# Patient Record
Sex: Male | Born: 1995 | Race: Black or African American | Hispanic: No | Marital: Single | Smoking: Current some day smoker
Health system: Southern US, Community
[De-identification: ages and names within clinical notes are randomized; demographics above are authoritative.]

## PROBLEM LIST (undated history)

## (undated) DIAGNOSIS — Z789 Other specified health status: Secondary | ICD-10-CM

## (undated) HISTORY — PX: OTHER SURGICAL HISTORY: SHX169

---

## 2015-01-04 ENCOUNTER — Emergency Department (HOSPITAL_BASED_OUTPATIENT_CLINIC_OR_DEPARTMENT_OTHER): Payer: Self-pay

## 2015-01-04 ENCOUNTER — Emergency Department (HOSPITAL_BASED_OUTPATIENT_CLINIC_OR_DEPARTMENT_OTHER)
Admission: EM | Admit: 2015-01-04 | Discharge: 2015-01-04 | Disposition: A | Discharge status: Home / Self Care | Payer: Self-pay | Attending: Emergency Medicine | Admitting: Emergency Medicine

## 2015-01-04 ENCOUNTER — Encounter (HOSPITAL_BASED_OUTPATIENT_CLINIC_OR_DEPARTMENT_OTHER): Payer: Self-pay | Admitting: Emergency Medicine

## 2015-01-04 DIAGNOSIS — Z720 Tobacco use: Secondary | ICD-10-CM | POA: Insufficient documentation

## 2015-01-04 DIAGNOSIS — M79674 Pain in right toe(s): Secondary | ICD-10-CM | POA: Insufficient documentation

## 2015-01-04 MED ORDER — HYDROCODONE-ACETAMINOPHEN 5-325 MG PO TABS: 1.0000 | ORAL_TABLET | Freq: Four times a day (QID) | ORAL | Status: DC | PRN

## 2015-01-04 MED ORDER — HYDROCODONE-ACETAMINOPHEN 5-325 MG PO TABS
1.0000 | ORAL_TABLET | Freq: Once | ORAL | Status: AC
  Administered 2015-01-04: 1 via ORAL
  Filled 2015-01-04: qty 1

## 2015-01-04 NOTE — ED Provider Notes (Signed)
CSN: [id number]     Arrival date & time 01-11-2015  0144 History   First MD Initiated Contact with Patient Jan 11, 2015 0159     Chief Complaint  Patient presents with  . Foot Pain     (Consider location/radiation/quality/duration/timing/severity/associated sxs/prior Treatment) HPI  This is a 19 year old male who states he fell 3 weeks ago and hurt his right foot. There are several abrasions to the medial aspect of the foot. He is complaining of worsening pain in his right MTP joint which he rates his "a 10 out of 10, it's on a whole new level". He reports some drainage from the wound at that site earlier in the week. Pain is worse with movement or palpation as well as with weightbearing. He states his other wounds are healing without difficulty  History reviewed. No pertinent past medical history. History reviewed. No pertinent past surgical history. History reviewed. No pertinent family history. Social History  Substance Use Topics  . Smoking status: Current Some Day Smoker  . Smokeless tobacco: None  . Alcohol Use: No    Review of Systems  All other systems reviewed and are negative.   Allergies  Review of patient's allergies indicates no known allergies.  Home Medications   Prior to Admission medications   Not on File   BP 124/90 mmHg  Pulse 76  Temp(Src) 98 F (36.7 C) (Oral)  Resp 18  Ht  (1.702 m)  Wt 178 lb (80.74 kg)  BMI 27.87 kg/m2  SpO2 98%   Physical Exam  General: Well-developed, well-nourished male in no acute distress; appearance consistent with age of record HENT: normocephalic; atraumatic Eyes: Normal appearance Neck: supple Heart: regular rate and rhythm Lungs: Normal respiratory effort and excursion Abdomen: soft; nondistended Extremities: No deformity; healing abrasions to medial right foot; tenderness and pain on passive range of motion of right first MTP joint without erythema, warmth or swelling   Neurologic: Awake, alert and oriented;  motor function intact in all extremities and symmetric; no facial droop Skin: Warm and dry Psychiatric: Normal mood and affect   ED Course  Procedures (including critical care time)   MDM  Nursing notes and vitals signs, including pulse oximetry, reviewed.  Summary of this visit's results, reviewed by myself:  Imaging Studies: Dg Foot Complete Right  01/11/2015   CLINICAL DATA:  Acute onset of right foot pain, with associated soft tissue swelling. Initial encounter.  EXAM: RIGHT FOOT COMPLETE - 3+ VIEW  COMPARISON:  None.  FINDINGS: There is no evidence of fracture or dislocation. The joint spaces are preserved. There is no evidence of talar subluxation; the subtalar joint is unremarkable in appearance.  No significant soft tissue abnormalities are seen.  IMPRESSION: No evidence of fracture or dislocation.   Electronically Signed   By: Roanna Raider M.D.   On: January 11, 2015 03:04   The patient's abrasions appeared to be healing well and without signs of infection on examination. Specifically there is no erythema or warmth associated with the right MTP joint. There is no purulent drainage noted and the wound base is dry. There is no x-ray evidence of osteomyelitis or fracture.    Paula Libra, MD 01/11/15 [id number]

## 2015-01-04 NOTE — ED Notes (Addendum)
Patient reports that the difference tonight is that "it has been paining me a 10/10 tonight". Patient reports that he can not put any pressure on his foot without limping. While trying to triage the patient he is talking on the phone and does not get off until asked by the RN, also texting during the triage process. No limping noted by the patient or swelling to the foot or area of pain.

## 2015-01-04 NOTE — ED Notes (Signed)
3 weeks ago the patient reports that he fell and hurt his right foot. The patient states that it now is not healing. The patient reports that his pain is the worst it has ever been.

## 2015-07-20 ENCOUNTER — Emergency Department (HOSPITAL_BASED_OUTPATIENT_CLINIC_OR_DEPARTMENT_OTHER)
Admission: EM | Admit: 2015-07-20 | Discharge: 2015-07-20 | Disposition: A | Discharge status: Home / Self Care | Payer: Self-pay | Attending: Emergency Medicine | Admitting: Emergency Medicine

## 2015-07-20 ENCOUNTER — Encounter (HOSPITAL_BASED_OUTPATIENT_CLINIC_OR_DEPARTMENT_OTHER): Payer: Self-pay | Admitting: *Deleted

## 2015-07-20 DIAGNOSIS — N342 Other urethritis: Secondary | ICD-10-CM | POA: Insufficient documentation

## 2015-07-20 DIAGNOSIS — F17200 Nicotine dependence, unspecified, uncomplicated: Secondary | ICD-10-CM | POA: Insufficient documentation

## 2015-07-20 MED ORDER — LIDOCAINE HCL (PF) 1 % IJ SOLN
INTRAMUSCULAR | Status: AC
  Administered 2015-07-20: 0.9 mL
  Filled 2015-07-20: qty 5

## 2015-07-20 MED ORDER — CEFTRIAXONE SODIUM 250 MG IJ SOLR
250.0000 mg | Freq: Once | INTRAMUSCULAR | Status: AC
  Administered 2015-07-20: 250 mg via INTRAMUSCULAR
  Filled 2015-07-20: qty 250

## 2015-07-20 MED ORDER — AZITHROMYCIN 250 MG PO TABS
1000.0000 mg | ORAL_TABLET | Freq: Once | ORAL | Status: AC
  Administered 2015-07-20: 1000 mg via ORAL
  Filled 2015-07-20: qty 4

## 2015-07-20 NOTE — ED Notes (Signed)
D/C home. Pt verbalizes understanding of f/u care

## 2015-07-20 NOTE — Discharge Instructions (Signed)
We will call you if your cultures indicate you require further treatment or additional action.  No sexual activity for the next 2 weeks.  Return to the ER if symptoms significantly worsen or change.   Urethritis, Adult Urethritis is an inflammation of the tube through which urine exits your bladder (urethra).  CAUSES Urethritis is often caused by an infection in your urethra. The infection can be viral, like herpes. The infection can also be bacterial, like gonorrhea. RISK FACTORS Risk factors of urethritis include:  Having sex without using a condom.  Having multiple sexual partners.  Having poor hygiene. SIGNS AND SYMPTOMS Symptoms of urethritis are less noticeable in women than in men. These symptoms include:  Burning feeling when you urinate (dysuria).  Discharge from your urethra.  Blood in your urine (hematuria).  Urinating more than usual. DIAGNOSIS  To confirm a diagnosis of urethritis, your health care provider will do the following:  Ask about your sexual history.  Perform a physical exam.  Have you provide a sample of your urine for lab testing.  Use a cotton swab to gently collect a sample from your urethra for lab testing. TREATMENT  It is important to treat urethritis. Depending on the cause, untreated urethritis may lead to serious genital infections and possibly infertility. Urethritis caused by a bacterial infection is treated with antibiotic medicine. All sexual partners must be treated.  HOME CARE INSTRUCTIONS  Do not have sex until the test results are known and treatment is completed, even if your symptoms go away before you finish treatment.  If you were prescribed an antibiotic, finish it all even if you start to feel better. SEEK MEDICAL CARE IF:   Your symptoms are not improved in 3 days.  Your symptoms are getting worse.  You develop abdominal pain or pelvic pain (in women).  You develop joint pain.  You have a fever. SEEK IMMEDIATE  MEDICAL CARE IF:   You have severe pain in the belly, back, or side.  You have repeated vomiting. MAKE SURE YOU:  Understand these instructions.  Will watch your condition.  Will get help right away if you are not doing well or get worse.   This information is not intended to replace advice given to you by your health care provider. Make sure you discuss any questions you have with your health care provider.   Document Released: 10/18/2000 Document Revised: 09/08/2014 Document Reviewed: 12/23/2012 Elsevier Interactive Patient Education [employer]2016 Elsevier Inc[employer]

## 2015-07-20 NOTE — ED Notes (Signed)
MD at bedside. 

## 2015-07-20 NOTE — ED Notes (Signed)
Pt amb to room 5 with quick steady gait smiling in nad. Pt reports burning at the tip of his penis with urination and discharge x Sunday.

## 2015-07-20 NOTE — ED Provider Notes (Signed)
CSN: [id number]     Arrival date & time 07/20/15  1039 History   First MD Initiated Contact with Patient 07/20/15 1059     Chief Complaint  Patient presents with  . Penile Discharge     (Consider location/radiation/quality/duration/timing/severity/associated sxs/prior Treatment) HPI Comments: Patient presents with complaints of urethral discharge and burning with urination for the past 2 days. He reports resuming sexual activity with his ex-girlfriend and suspects he may have gotten STD. She is also here with him seeking treatment.  Patient is a 20 y.o. male presenting with penile discharge. The history is provided by the patient.  Penile Discharge This is a new problem. The current episode started 2 days ago. The problem occurs constantly. The problem has been rapidly worsening. Nothing aggravates the symptoms. Nothing relieves the symptoms. He has tried nothing for the symptoms. The treatment provided no relief.    History reviewed. No pertinent past medical history. History reviewed. No pertinent past surgical history. History reviewed. No pertinent family history. Social History  Substance Use Topics  . Smoking status: Current Some Day Smoker  . Smokeless tobacco: None  . Alcohol Use: No    Review of Systems  Genitourinary: Positive for discharge.  All other systems reviewed and are negative.     Allergies  Review of patient's allergies indicates no known allergies.  Home Medications   Prior to Admission medications   Not on File   BP 123/79 mmHg  Pulse 58  Temp(Src) 98.3 F (36.8 C) (Oral)  Resp 16  Ht 5\' 7"  (1.702 m)  Wt 131 lb (59.421 kg)  BMI 20.51 kg/m2  SpO2 100% Physical Exam  Constitutional: He is oriented to person, place, and time. He appears well-developed and well-nourished. No distress.  HENT:  Head: Normocephalic and atraumatic.  Neck: Normal range of motion. Neck supple.  Genitourinary:  There is purulent urethral discharge present. He is  exquisitely tender with palpation and urethral swab insertion.  Neurological: He is alert and oriented to person, place, and time.  Skin: Skin is warm and dry. He is not diaphoretic.  Nursing note and vitals reviewed.   ED Course  Procedures (including critical care time) Labs Review Labs Reviewed - No data to display  Imaging Review No results found. I have personally reviewed and evaluated these images and lab results as part of my medical decision-making.   MDM   Final diagnoses:  None   I had a high suspicion for gonorrhea or chlamydia. We'll treat with Rocephin and Zithromax. Urethral swab sent to the lab.    Geoffery Lyonsouglas Zandrea Kenealy, MD 07/20/15 1115

## 2015-07-21 LAB — GC/CHLAMYDIA PROBE AMP (~~LOC~~) NOT AT ARMC
Chlamydia: NEGATIVE
NEISSERIA GONORRHEA: POSITIVE — AB

## 2015-07-22 ENCOUNTER — Telehealth: Payer: Self-pay | Admitting: *Deleted

## 2015-07-22 NOTE — ED Notes (Signed)
Spoke with patient, verified ID, informed of labs, treated per protocol, DHHS form faxed, patient informed to abstain for sexual activity x 10 days and notify sexual partners for evaluation and treatment 

## 2015-08-12 ENCOUNTER — Emergency Department (HOSPITAL_BASED_OUTPATIENT_CLINIC_OR_DEPARTMENT_OTHER): Payer: Self-pay

## 2015-08-12 ENCOUNTER — Encounter (HOSPITAL_BASED_OUTPATIENT_CLINIC_OR_DEPARTMENT_OTHER): Payer: Self-pay | Admitting: Emergency Medicine

## 2015-08-12 ENCOUNTER — Emergency Department (HOSPITAL_BASED_OUTPATIENT_CLINIC_OR_DEPARTMENT_OTHER)
Admission: EM | Admit: 2015-08-12 | Discharge: 2015-08-12 | Disposition: A | Discharge status: Home / Self Care | Payer: Self-pay | Attending: Emergency Medicine | Admitting: Emergency Medicine

## 2015-08-12 DIAGNOSIS — F17210 Nicotine dependence, cigarettes, uncomplicated: Secondary | ICD-10-CM | POA: Insufficient documentation

## 2015-08-12 DIAGNOSIS — M25522 Pain in left elbow: Secondary | ICD-10-CM

## 2015-08-12 DIAGNOSIS — S59902A Unspecified injury of left elbow, initial encounter: Secondary | ICD-10-CM | POA: Insufficient documentation

## 2015-08-12 DIAGNOSIS — Y9389 Activity, other specified: Secondary | ICD-10-CM | POA: Insufficient documentation

## 2015-08-12 DIAGNOSIS — Y998 Other external cause status: Secondary | ICD-10-CM | POA: Insufficient documentation

## 2015-08-12 DIAGNOSIS — Y9289 Other specified places as the place of occurrence of the external cause: Secondary | ICD-10-CM | POA: Insufficient documentation

## 2015-08-12 MED ORDER — ACETAMINOPHEN 500 MG PO TABS
1000.0000 mg | ORAL_TABLET | Freq: Once | ORAL | Status: AC
  Administered 2015-08-12: 1000 mg via ORAL
  Filled 2015-08-12: qty 2
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
## 2015-08-12 NOTE — ED Notes (Signed)
MD at bedside. 

## 2015-08-12 NOTE — ED Notes (Signed)
Patient advised not to wear sling while sleeping.

## 2015-08-12 NOTE — ED Notes (Signed)
Patient is alert and oriented x3.  He was given DC instructions and follow up visit instructions.  Patient gave verbal understanding.  He was DC ambulatory under his own power to home.  V/S stable.  He was not showing any signs of distress on DC 

## 2015-08-12 NOTE — ED Notes (Signed)
Work note given for 4/5-10/2015 per Dr Shela CommonsJ.

## 2015-08-12 NOTE — Discharge Instructions (Signed)
Take Tylenol as directed for pain. Wear the sling as needed for comfort. If you have significant pain or difficulty using her elbow in one week call Dr. Pearletha ForgeHudnall to schedule appointment. There is a possibility that you have a tiny break in your left elbow, not seen on today's x-ray How to Use a Sling A sling is a type of hanging bandage that is worn around your neck to protect an injured arm, shoulder, or other body part. You may need to wear a sling to keep you from moving (immobilize) the injured body part while it heals. Keeping the injured part of your body still reduces pain and speeds up healing. Your health care provider may recommend using a sling if you have:   A broken arm.  A broken collarbone.  A shoulder injury.  Surgery. RISKS AND COMPLICATIONS Wearing a sling the wrong way can:  Make your injury worse.  Cause stiffness or numbness.  Affect blood circulation in your arm and hand. This can causetingling or numbness in your fingers or hands. HOW TO USE A SLING The way that you should use a sling depends on your injury. It is important that you follow all of your health care provider's instructions for your injury. Also follow these general guidelines:  Wear the sling so that your arm bends 90 degrees at the elbow. That is like a right angle or the shape of a capital letter "L." The sling should also support your wrist and your hand.  Try to avoid moving your arm.  Do not lie down flat on your back while wearing a sling. Sleep in a recliner or use pillows to raise your upper body in bed.  Do not twist, raise, or move your arm in a way that could make your injury worse.  Do not lean on your arm while wearing a sling.  Do not lift anything while wearing a sling. SEEK MEDICAL CARE IF:  You have bruising, swelling, or pain that is getting worse.  Your pain medicine is not helping.  You have a fever. SEEK IMMEDIATE MEDICAL CARE IF:  Your fingers are numb or  tingling.  Your fingers turn blue or feel cold to the touch.  You cannot control the bleeding from your injury.  You are short of breath.   This information is not intended to replace advice given to you by your health care provider. Make sure you discuss any questions you have with your health care provider.   Document Released: 12/07/2003 Document Revised: 05/15/2014 Document Reviewed: 02/25/2014 Elsevier Interactive Patient Education [employer]2016 Elsevier Inc.

## 2015-08-12 NOTE — ED Notes (Signed)
Patient returned from XR. 
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
## 2015-12-15 ENCOUNTER — Encounter (HOSPITAL_BASED_OUTPATIENT_CLINIC_OR_DEPARTMENT_OTHER): Payer: Self-pay

## 2015-12-15 ENCOUNTER — Emergency Department (HOSPITAL_BASED_OUTPATIENT_CLINIC_OR_DEPARTMENT_OTHER)
Admission: EM | Admit: 2015-12-15 | Discharge: 2015-12-15 | Disposition: A | Discharge status: Home / Self Care | Payer: Medicaid Other | Attending: Emergency Medicine | Admitting: Emergency Medicine

## 2015-12-15 ENCOUNTER — Emergency Department (HOSPITAL_BASED_OUTPATIENT_CLINIC_OR_DEPARTMENT_OTHER): Payer: Medicaid Other

## 2015-12-15 DIAGNOSIS — S161XXD Strain of muscle, fascia and tendon at neck level, subsequent encounter: Secondary | ICD-10-CM

## 2015-12-15 DIAGNOSIS — S39012D Strain of muscle, fascia and tendon of lower back, subsequent encounter: Secondary | ICD-10-CM

## 2015-12-15 DIAGNOSIS — F17210 Nicotine dependence, cigarettes, uncomplicated: Secondary | ICD-10-CM | POA: Insufficient documentation

## 2015-12-15 DIAGNOSIS — Y92410 Unspecified street and highway as the place of occurrence of the external cause: Secondary | ICD-10-CM | POA: Insufficient documentation

## 2015-12-15 DIAGNOSIS — Y939 Activity, unspecified: Secondary | ICD-10-CM | POA: Insufficient documentation

## 2015-12-15 DIAGNOSIS — Y999 Unspecified external cause status: Secondary | ICD-10-CM | POA: Insufficient documentation

## 2015-12-15 MED ORDER — CYCLOBENZAPRINE HCL 10 MG PO TABS
10.0000 mg | ORAL_TABLET | Freq: Once | ORAL | Status: AC
  Administered 2015-12-15: 10 mg via ORAL
  Filled 2015-12-15: qty 1

## 2015-12-15 MED ORDER — CYCLOBENZAPRINE HCL 10 MG PO TABS: 10.0000 mg | ORAL_TABLET | Freq: Three times a day (TID) | ORAL | 0 refills | Status: DC

## 2015-12-15 MED ORDER — IBUPROFEN 800 MG PO TABS: 800.0000 mg | ORAL_TABLET | Freq: Three times a day (TID) | ORAL | 0 refills | Status: DC

## 2015-12-15 MED ORDER — KETOROLAC TROMETHAMINE 60 MG/2ML IM SOLN
60.0000 mg | Freq: Once | INTRAMUSCULAR | Status: AC
  Administered 2015-12-15: 60 mg via INTRAMUSCULAR
  Filled 2015-12-15: qty 2
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
## 2015-12-15 NOTE — ED Notes (Signed)
MD at bedside. 

## 2015-12-15 NOTE — ED Notes (Signed)
Pt verbalizes understanding of d/c instructions and denies any further needs at this time. 

## 2015-12-15 NOTE — ED Notes (Signed)
Patient transported to X-ray 
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
## 2017-06-05 ENCOUNTER — Emergency Department (HOSPITAL_BASED_OUTPATIENT_CLINIC_OR_DEPARTMENT_OTHER)
Admission: EM | Admit: 2017-06-05 | Discharge: 2017-06-05 | Disposition: A | Discharge status: Home / Self Care | Payer: Self-pay | Attending: Emergency Medicine | Admitting: Emergency Medicine

## 2017-06-05 ENCOUNTER — Encounter (HOSPITAL_BASED_OUTPATIENT_CLINIC_OR_DEPARTMENT_OTHER): Payer: Self-pay | Admitting: Emergency Medicine

## 2017-06-05 ENCOUNTER — Other Ambulatory Visit: Payer: Self-pay

## 2017-06-05 DIAGNOSIS — R509 Fever, unspecified: Secondary | ICD-10-CM | POA: Insufficient documentation

## 2017-06-05 DIAGNOSIS — F17210 Nicotine dependence, cigarettes, uncomplicated: Secondary | ICD-10-CM | POA: Insufficient documentation

## 2017-06-05 DIAGNOSIS — N342 Other urethritis: Secondary | ICD-10-CM | POA: Insufficient documentation

## 2017-06-05 DIAGNOSIS — R05 Cough: Secondary | ICD-10-CM | POA: Insufficient documentation

## 2017-06-05 DIAGNOSIS — R369 Urethral discharge, unspecified: Secondary | ICD-10-CM | POA: Insufficient documentation

## 2017-06-05 DIAGNOSIS — Z202 Contact with and (suspected) exposure to infections with a predominantly sexual mode of transmission: Secondary | ICD-10-CM | POA: Insufficient documentation

## 2017-06-05 MED ORDER — AZITHROMYCIN 250 MG PO TABS
1000.0000 mg | ORAL_TABLET | Freq: Once | ORAL | Status: AC
  Administered 2017-06-05: 1000 mg via ORAL
  Filled 2017-06-05: qty 4

## 2017-06-05 MED ORDER — CEFTRIAXONE SODIUM 250 MG IJ SOLR
250.0000 mg | Freq: Once | INTRAMUSCULAR | Status: AC
  Administered 2017-06-05: 250 mg via INTRAMUSCULAR
  Filled 2017-06-05: qty 250

## 2017-06-05 NOTE — ED Triage Notes (Signed)
Pt reports flu like sx but then states his girlfriend told him that she tested positive for an STD. Pt does report penile drainage. States he is here for STD treatment.

## 2017-06-05 NOTE — ED Provider Notes (Signed)
[facility] EMERGENCY DEPARTMENT Provider Note   CSN: [id number] Arrival date & time: 06/05/17  0023     History   Chief Complaint Chief Complaint  Patient presents with  . **  Chief complaint - STD check  HPI Shawn Flynn is a 22 y.o. male.  The history is provided by the patient.  Patient reports he is in the emergency department for evaluation of possible STD.  He reports his girlfriend informed him that she recently tested positive for gonorrhea.  Patient reports that he has had some mild penile discharge.  He reports recently having fever and cough.  He denies any other complaints.  No significant abdominal pain.  His course is stable.  Nothing improves his symptoms.  PMH-none Past Surgical History:  Procedure Laterality Date  . arm surgery         Home Medications    Prior to Admission medications   Medication Sig Start Date End Date Taking? Authorizing Provider  cyclobenzaprine (FLEXERIL) 10 MG tablet Take 1 tablet (10 mg total) by mouth 3 (three) times daily. 12/15/15   Arby Barrette, MD  ibuprofen (ADVIL,MOTRIN) 800 MG tablet Take 1 tablet (800 mg total) by mouth 3 (three) times daily. 12/15/15   Arby Barrette, MD    Family History No family history on file.  Social History Social History   Tobacco Use  . Smoking status: Current Some Day Smoker    Packs/day: 0.50    Types: Cigarettes  . Smokeless tobacco: Never Used  Substance Use Topics  . Alcohol use: No  . Drug use: No     Allergies   Patient has no known allergies.   Review of Systems Review of Systems  Constitutional: Positive for fever.  Genitourinary: Positive for discharge.     Physical Exam Updated Vital Signs BP 134/90 (BP Location: Left Arm)   Pulse 86   Temp 98.9 F (37.2 C) (Oral)   Resp 18   SpO2 99%   Physical Exam CONSTITUTIONAL: Well developed/well nourished, sleeping on arrival to room HEAD: Normocephalic/atraumatic ENMT: Mucous membranes moist NECK:  supple no meningeal signs CV: S1/S2 noted, no murmurs/rubs/gallops noted LUNGS: Lungs are clear to auscultation bilaterally, no apparent distress ABDOMEN: soft, nontender GU:no cva tenderness Nurse Tresa Endo present for entire exam, no penile discharge, no penile lesions, no scrotal edema noted NEURO: Pt is awake/alert/appropriate, moves all extremitiesx4.  No facial droop.   EXTREMITIES:  full ROM SKIN: warm, color normal PSYCH: no abnormalities of mood noted, alert and oriented to situation   ED Treatments / Results  Labs (all labs ordered are listed, but only abnormal results are displayed) Labs Reviewed  HIV ANTIBODY (ROUTINE TESTING)  GC/CHLAMYDIA PROBE AMP ([facility]) NOT AT [facility]    EKG  EKG Interpretation None       Radiology No results found.  Procedures Procedures (including critical care time)  Medications Ordered in ED Medications  cefTRIAXone (ROCEPHIN) injection 250 mg (not administered)  azithromycin (ZITHROMAX) tablet 1,000 mg (not administered)     Initial Impression / Assessment and Plan / ED Course  I have reviewed the triage vital signs and the nursing notes.     Plan to empirically treat for gonorrhea and chlamydia here in the emergency department.  Will also test for gonorrhea/chlamydia.  Patient will also have HIV testing performed.  Advised to avoid all sexual contact until all symptoms resolved until all labs have resulted.  Final Clinical Impressions(s) / ED Diagnoses   Final diagnoses:  STD  exposure  Urethritis    ED Discharge Orders    None       Zadie RhineWickline, Sharai Overbay, MD 06/05/17 [phone]

## 2017-06-06 LAB — HIV ANTIBODY (ROUTINE TESTING W REFLEX): HIV Screen 4th Generation wRfx: NONREACTIVE

## 2017-06-06 LAB — GC/CHLAMYDIA PROBE AMP (~~LOC~~) NOT AT ARMC
Chlamydia: POSITIVE — AB
NEISSERIA GONORRHEA: NEGATIVE

## 2017-06-22 ENCOUNTER — Emergency Department (HOSPITAL_BASED_OUTPATIENT_CLINIC_OR_DEPARTMENT_OTHER)
Admission: EM | Admit: 2017-06-22 | Discharge: 2017-06-22 | Disposition: A | Discharge status: Home / Self Care | Payer: Self-pay | Attending: Emergency Medicine | Admitting: Emergency Medicine

## 2017-06-22 ENCOUNTER — Other Ambulatory Visit: Payer: Self-pay

## 2017-06-22 ENCOUNTER — Encounter (HOSPITAL_BASED_OUTPATIENT_CLINIC_OR_DEPARTMENT_OTHER): Payer: Self-pay

## 2017-06-22 DIAGNOSIS — R51 Headache: Secondary | ICD-10-CM | POA: Insufficient documentation

## 2017-06-22 DIAGNOSIS — K0889 Other specified disorders of teeth and supporting structures: Secondary | ICD-10-CM | POA: Insufficient documentation

## 2017-06-22 NOTE — ED Triage Notes (Signed)
Pt c/o left temporal HA, pain to left upper, lower teeth and sore throat-x today-NAD-steady gait

## 2017-06-22 NOTE — ED Notes (Signed)
After triage pt was notified of wait-states he is not waiting and left-NAD-fast paced steady gait

## 2017-11-25 ENCOUNTER — Other Ambulatory Visit: Payer: Self-pay

## 2017-11-25 ENCOUNTER — Encounter (HOSPITAL_BASED_OUTPATIENT_CLINIC_OR_DEPARTMENT_OTHER): Payer: Self-pay | Admitting: *Deleted

## 2017-11-25 ENCOUNTER — Emergency Department (HOSPITAL_BASED_OUTPATIENT_CLINIC_OR_DEPARTMENT_OTHER)
Admission: EM | Admit: 2017-11-25 | Discharge: 2017-11-26 | Disposition: A | Discharge status: Home / Self Care | Payer: Self-pay | Attending: Emergency Medicine | Admitting: Emergency Medicine

## 2017-11-25 ENCOUNTER — Emergency Department (HOSPITAL_BASED_OUTPATIENT_CLINIC_OR_DEPARTMENT_OTHER): Payer: Self-pay

## 2017-11-25 DIAGNOSIS — S0101XA Laceration without foreign body of scalp, initial encounter: Secondary | ICD-10-CM | POA: Insufficient documentation

## 2017-11-25 DIAGNOSIS — S0181XA Laceration without foreign body of other part of head, initial encounter: Secondary | ICD-10-CM | POA: Insufficient documentation

## 2017-11-25 DIAGNOSIS — Y999 Unspecified external cause status: Secondary | ICD-10-CM | POA: Insufficient documentation

## 2017-11-25 DIAGNOSIS — S02601A Fracture of unspecified part of body of right mandible, initial encounter for closed fracture: Secondary | ICD-10-CM

## 2017-11-25 DIAGNOSIS — W01198A Fall on same level from slipping, tripping and stumbling with subsequent striking against other object, initial encounter: Secondary | ICD-10-CM | POA: Insufficient documentation

## 2017-11-25 DIAGNOSIS — F17210 Nicotine dependence, cigarettes, uncomplicated: Secondary | ICD-10-CM | POA: Insufficient documentation

## 2017-11-25 DIAGNOSIS — Y929 Unspecified place or not applicable: Secondary | ICD-10-CM | POA: Insufficient documentation

## 2017-11-25 DIAGNOSIS — Y9389 Activity, other specified: Secondary | ICD-10-CM | POA: Insufficient documentation

## 2017-11-25 DIAGNOSIS — Z23 Encounter for immunization: Secondary | ICD-10-CM | POA: Insufficient documentation

## 2017-11-25 MED ORDER — TETANUS-DIPHTH-ACELL PERTUSSIS 5-2.5-18.5 LF-MCG/0.5 IM SUSP
0.5000 mL | Freq: Once | INTRAMUSCULAR | Status: AC
  Administered 2017-11-25: 0.5 mL via INTRAMUSCULAR
  Filled 2017-11-25: qty 0.5
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
## 2017-11-25 NOTE — ED Notes (Signed)
ED Provider at bedside. 

## 2017-11-26 ENCOUNTER — Ambulatory Visit (HOSPITAL_COMMUNITY)
Admission: RE | Admit: 2017-11-26 | Discharge: 2017-11-26 | Disposition: A | Discharge status: Home / Self Care | Payer: Self-pay | Source: Ambulatory Visit | Attending: Oral Surgery | Admitting: Oral Surgery

## 2017-11-26 ENCOUNTER — Ambulatory Visit: Payer: Self-pay | Admitting: Oral Surgery

## 2017-11-26 ENCOUNTER — Encounter (HOSPITAL_COMMUNITY): Payer: Self-pay | Admitting: *Deleted

## 2017-11-26 ENCOUNTER — Encounter (HOSPITAL_COMMUNITY)
Admission: RE | Disposition: A | Discharge status: Home / Self Care | Payer: Self-pay | Source: Ambulatory Visit | Attending: Oral Surgery

## 2017-11-26 DIAGNOSIS — S02609B Fracture of mandible, unspecified, initial encounter for open fracture: Secondary | ICD-10-CM | POA: Insufficient documentation

## 2017-11-26 DIAGNOSIS — Z538 Procedure and treatment not carried out for other reasons: Secondary | ICD-10-CM | POA: Insufficient documentation

## 2017-11-26 HISTORY — DX: Other specified health status: Z78.9

## 2017-11-26 SURGERY — OPEN REDUCTION INTERNAL FIXATION (ORIF) MANDIBULAR FRACTURE
Anesthesia: General | Laterality: Right

## 2017-11-26 MED ORDER — DEXAMETHASONE SODIUM PHOSPHATE 10 MG/ML IJ SOLN
INTRAMUSCULAR | Status: AC
  Filled 2017-11-26: qty 1

## 2017-11-26 MED ORDER — CHLORHEXIDINE GLUCONATE 0.12 % MT SOLN
15.0000 mL | Freq: Two times a day (BID) | OROMUCOSAL | Status: DC
  Filled 2017-11-26: qty 15

## 2017-11-26 MED ORDER — FENTANYL CITRATE (PF) 250 MCG/5ML IJ SOLN
INTRAMUSCULAR | Status: AC
  Filled 2017-11-26: qty 5

## 2017-11-26 MED ORDER — MIDAZOLAM HCL 2 MG/2ML IJ SOLN
INTRAMUSCULAR | Status: AC
  Filled 2017-11-26: qty 2

## 2017-11-26 MED ORDER — PROPOFOL 10 MG/ML IV BOLUS
INTRAVENOUS | Status: AC
  Filled 2017-11-26: qty 20

## 2017-11-26 MED ORDER — DEXAMETHASONE SODIUM PHOSPHATE 10 MG/ML IJ SOLN: 10.0000 mg | Freq: Once | INTRAMUSCULAR | Status: DC

## 2017-11-26 MED ORDER — MORPHINE SULFATE (PF) 4 MG/ML IV SOLN
4.0000 mg | Freq: Once | INTRAVENOUS | Status: AC
  Administered 2017-11-26: 4 mg via INTRAVENOUS
  Filled 2017-11-26: qty 1

## 2017-11-26 MED ORDER — CLINDAMYCIN PHOSPHATE 600 MG/50ML IV SOLN
600.0000 mg | Freq: Once | INTRAVENOUS | Status: AC
  Administered 2017-11-26: 600 mg via INTRAVENOUS
  Filled 2017-11-26: qty 50

## 2017-11-26 MED ORDER — LACTATED RINGERS IV SOLN: INTRAVENOUS | Status: DC

## 2017-11-26 MED ORDER — CEFAZOLIN SODIUM-DEXTROSE 2-4 GM/100ML-% IV SOLN
INTRAVENOUS | Status: AC
  Filled 2017-11-26: qty 100

## 2017-11-26 MED ORDER — OXYCODONE-ACETAMINOPHEN 5-325 MG PO TABS: 1.0000 | ORAL_TABLET | Freq: Four times a day (QID) | ORAL | 0 refills | Status: DC | PRN

## 2017-11-26 MED ORDER — CEFAZOLIN SODIUM-DEXTROSE 2-4 GM/100ML-% IV SOLN: 2.0000 g | INTRAVENOUS | Status: DC

## 2017-11-26 NOTE — ED Notes (Signed)
ED Provider at bedside. 

## 2017-11-26 NOTE — Pre-Procedure Instructions (Signed)
    Corrie Dandyimothy J Sandridge  11/26/2017     Your procedure is scheduled on 11/27/2017.  [facility] at [facility].M.  Call this number if you have problems the morning of surgery:  [phone]   Remember:  Do not eat or drink after midnight.     Take these medicines the morning of surgery with A SIP OF WATER Oxycodone    Do not wear jewelry, make-up or nail polish.  Do not wear lotions, powders, or perfumes, or deodorant.  Do not shave 48 hours prior to surgery.  Men may shave face and neck.  Do not bring valuables to the hospital.  [facility] is not responsible for any belongings or valuables.  Contacts, dentures or bridgework may not be worn into surgery.  Leave your suitcase in the car.  After surgery it may be brought to your room.  For patients admitted to the hospital, discharge time will be determined by your treatment team.  Patients discharged the day of surgery will not be allowed to drive home.   [facility] - Preparing for Surgery  Before surgery, you can play an important role.  Because skin is not sterile, your skin needs to be as free of germs as possible.  You can reduce the number of germs on you skin by washing with CHG (chlorahexidine gluconate) soap before surgery.  CHG is an antiseptic cleaner which kills germs and bonds with the skin to continue killing germs even after washing.  Please DO NOT use if you have an allergy to CHG or antibacterial soaps.  If your skin becomes reddened/irritated stop using the CHG and inform your nurse when you arrive at Short Stay.  Do not shave (including legs and underarms) for at least 48 hours prior to the first CHG shower.  You may shave your face.  Please follow these instructions carefully:   1.  Shower with CHG Soap the night before surgery and the morning of Surgery.  2.  If you choose to wash your hair, wash your hair first as usual with your normal shampoo.  3.  After you shampoo, rinse your hair and  body thoroughly to remove the shampoo.  4.  Use CHG as you would any other liquid soap.  You can apply CHG directly to the skin and wash gently with scrungie or a clean washcloth.  5.  Apply the CHG Soap to your body ONLY FROM THE NECK DOWN.  Do not use on open wounds or open sores.  Avoid contact with your eyes, ears, mouth and genitals (private parts).  Wash genitals (private parts) with your normal soap.  6.  Wash thoroughly, paying special attention to the area where your surgery will be performed.  7.  Thoroughly rinse your body with warm water from the neck down.  8.  DO NOT shower/wash with your normal soap after using and rinsing off the CHG Soap.  9.  Pat yourself dry with a clean towel.            10.  Wear clean pajamas.            11.  Place clean sheets on your bed the night of your first shower and do not sleep with pets.  Day of Surgery  Do not apply any lotions the morning of surgery.  Please wear clean clothes to the hospital/surgery center.

## 2017-11-26 NOTE — ED Notes (Signed)
Patient transported to CT 

## 2017-11-26 NOTE — Progress Notes (Signed)
Dr. Krista BlueSinger made aware of PO intake. Per Dr. Krista BlueSinger delay surgery until 1930
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
## 2017-11-26 NOTE — ED Provider Notes (Signed)
[facility] EMERGENCY DEPARTMENT Provider Note   CSN: [id number] Arrival date & time: 11/25/17  2329     History   Chief Complaint Chief Complaint  Patient presents with  . Facial Laceration    HPI Quindarius Cabello is a 22 y.o. male.  HPI  This is a 22 year old male who presents with facial laceration.  Patient reports that he was running and playing with a family member when he fell hitting his face on the counter.  He denies loss of consciousness.  He reports that his right jaw hurts as well as 2 lacerations to the right side of the head.  He does not take blood thinners.  Denies any vomiting.  This happened just prior to arrival.  Unknown last tetanus shot.  History reviewed. No pertinent past medical history.  There are no active problems to display for this patient.   Past Surgical History:  Procedure Laterality Date  . arm surgery          Home Medications    Prior to Admission medications   Medication Sig Start Date End Date Taking? Authorizing Provider  oxyCODONE-acetaminophen (PERCOCET/ROXICET) 5-325 MG tablet Take 1-2 tablets by mouth every 6 (six) hours as needed for severe pain. 11/26/17   Horton, Mayer Masker, MD    Family History History reviewed. No pertinent family history.  Social History Social History   Tobacco Use  . Smoking status: Current Some Day Smoker    Packs/day: 2.00    Types: Cigarettes  . Smokeless tobacco: Never Used  Substance Use Topics  . Alcohol use: No  . Drug use: No     Allergies   Patient has no known allergies.   Review of Systems Review of Systems  Constitutional: Negative for fever.  HENT:       Facial pain  Respiratory: Negative for shortness of breath.   Cardiovascular: Negative for chest pain.  Gastrointestinal: Negative for abdominal pain, nausea and vomiting.  Skin: Positive for wound.  Neurological: Negative for headaches.  All other systems reviewed and are negative.    Physical  Exam Updated Vital Signs BP (!) 120/101   Pulse 65   Temp 98.8 F (37.1 C) (Oral)   Resp 16   Ht 5\' 7"  (1.702 m)   Wt 61.2 kg (135 lb)   SpO2 99%   BMI 21.14 kg/m   Physical Exam  Constitutional: He is oriented to person, place, and time. He appears well-developed and well-nourished. No distress.  ABCs intact  HENT:  Head: Normocephalic.  2 cm laceration just adjacent to the scalp line over the right forehead, superficial, mildly gaping Vertical 3 cm laceration over the right temporal region, bleeding controlled, mildly gaping, superficial Tenderness palpation right jaw with slight swelling, trismus noted No hemotympanum  Eyes: Pupils are equal, round, and reactive to light. EOM are normal.  Neck: Normal range of motion. Neck supple.  No midline C-spine tenderness to palpation  Cardiovascular: Normal rate, regular rhythm and normal heart sounds.  No murmur heard. Pulmonary/Chest: Effort normal and breath sounds normal. No respiratory distress. He has no wheezes.  Abdominal: Soft. There is no tenderness. There is no rebound.  Musculoskeletal: He exhibits no edema or deformity.  Neurological: He is alert and oriented to person, place, and time.  Skin: Skin is warm and dry.  Psychiatric: He has a normal mood and affect.  Nursing note and vitals reviewed.    ED Treatments / Results  Labs (all labs ordered are listed, but  only abnormal results are displayed) Labs Reviewed - No data to display  EKG None  Radiology Ct Maxillofacial Wo Contrast  Result Date: 11/26/2017 CLINICAL DATA:  Initial evaluation for acute trauma, right facial laceration. EXAM: CT MAXILLOFACIAL WITHOUT CONTRAST TECHNIQUE: Multidetector CT imaging of the maxillofacial structures was performed. Multiplanar CT image reconstructions were also generated. COMPARISON:  None. FINDINGS: Osseous: Zygomatic arches intact. No acute maxillary fracture. Pterygoid plates intact. Nasal bones intact. Nasal septum bowed  to the right but intact. There is an acute nondisplaced fracture extending through the right mandibular body near the angle of the mandible. Extension into the root of the right third mandibular molar. Mandibular condyles remain normally situated. Few scattered prominent dental caries noted, most notable about the second mandibular molars bilaterally. Orbits: Globes and orbital soft tissues demonstrate no acute abnormality. Bony orbits intact. Sinuses: Mild scattered mucosal thickening within the maxillary sinuses bilaterally, left greater than right. No hemosinus. Mastoid air cells and middle ear cavities are clear. Soft tissues: Mild soft tissue swelling overlies the right mandibular fracture. Limited intracranial: Unremarkable. IMPRESSION: 1. Acute nondisplaced fracture extending through the right mandibular body near the angle of the right mandible. 2. No other acute maxillofacial injury identified. 3. Prominent dental caries, most notable at the second mandibular molars bilaterally. Electronically Signed   By: Rise MuBenjamin  McClintock M.D.   On: 11/26/2017 00:41    Procedures .Marland Kitchen.Laceration Repair Date/Time: 11/26/2017 2:16 AM Performed by: Shon BatonHorton, Courtney F, MD Authorized by: Shon BatonHorton, Courtney F, MD   Consent:    Consent obtained:  Verbal   Consent given by:  Patient   Risks discussed:  Infection, pain and poor cosmetic result   Alternatives discussed:  No treatment Anesthesia (see MAR for exact dosages):    Anesthesia method:  None Laceration details:    Location:  Scalp   Scalp location:  Frontal   Length (cm):  2   Depth (mm):  2 Repair type:    Repair type:  Simple Exploration:    Contaminated: no   Treatment:    Area cleansed with:  Betadine   Amount of cleaning:  Standard   Irrigation solution:  Sterile saline   Irrigation method:  Syringe   Visualized foreign bodies/material removed: no   Skin repair:    Repair method:  Steri-Strips and tissue adhesive   Number of Steri-Strips:   2 Approximation:    Approximation:  Close Post-procedure details:    Dressing:  Open (no dressing)   Patient tolerance of procedure:  Tolerated well, no immediate complications .Marland Kitchen.Laceration Repair Date/Time: 11/26/2017 2:17 AM Performed by: Shon BatonHorton, Courtney F, MD Authorized by: Shon BatonHorton, Courtney F, MD   Consent:    Consent obtained:  Verbal   Consent given by:  Patient   Risks discussed:  Infection, pain and poor cosmetic result   Alternatives discussed:  No treatment Laceration details:    Location:  Face   Face location:  Forehead   Length (cm):  3   Depth (mm):  2 Repair type:    Repair type:  Simple Exploration:    Contaminated: no   Treatment:    Area cleansed with:  Betadine   Amount of cleaning:  Standard   Irrigation solution:  Sterile saline   Irrigation method:  Syringe Skin repair:    Repair method:  Steri-Strips and tissue adhesive   Number of Steri-Strips:  3 Approximation:    Approximation:  Close Post-procedure details:    Dressing:  Open (no dressing)   Patient tolerance  of procedure:  Tolerated well, no immediate complications   (including critical care time)  Medications Ordered in ED Medications  Tdap (BOOSTRIX) injection 0.5 mL (0.5 mLs Intramuscular Given 11/25/17 2356)  clindamycin (CLEOCIN) IVPB 600 mg (0 mg Intravenous Stopped 11/26/17 0206)  morphine 4 MG/ML injection 4 mg (4 mg Intravenous Given 11/26/17 0130)     Initial Impression / Assessment and Plan / ED Course  I have reviewed the triage vital signs and the nursing notes.  Pertinent labs & imaging results that were available during my care of the patient were reviewed by me and considered in my medical decision making (see chart for details).  Clinical Course as of Nov 26 217  Mon Nov 26, 2017  0139 Discussed with Drab, ENT.  We will give a dose of clindamycin.  Prescribe Peridex.  Have patient follow a soft, non-chew diet.  We will have him call his office at 8 AM.  Surgery likely.    [CH]    Clinical Course User Index [CH] Horton, Mayer Masker, MD    Patient presents with 2 fairly superficial lacerations to the face which are nonbleeding.  Also with right jaw pain he has trismus on exam.  Slight swelling of the right jaw.  Otherwise no other notable injury.  Lacerations were repaired with glue.  CT max face ordered to evaluate for jaw injury.  1:15 AM CT scan with a right nondisplaced mandible fracture.  Patient continues to provide the same history although this does not seem consistent with his injury.  ENT consulted.  Patient was dosed of clindamycin.  Will discharge with pain medication.  Stressed close follow-up with Dr. Kenney Houseman was very important.  Soft diet.    Final Clinical Impressions(s) / ED Diagnoses   Final diagnoses:  Facial laceration, initial encounter  Closed fracture of right side of mandibular body, initial encounter [facility])    ED Discharge Orders        Ordered    oxyCODONE-acetaminophen (PERCOCET/ROXICET) 5-325 MG tablet  Every 6 hours PRN     11/26/17 1601       Shon Baton, MD 11/26/17 [phone]
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
## 2017-11-26 NOTE — Discharge Instructions (Signed)
You were seen today for a mandible fracture and lacerations to the face.  It is very important that you follow-up first thing in the morning with Dr. Kenney Housemanrab.  You likely will need surgery.  Maintain a soft, non-chew diet until follow-up.  Take medications as prescribed.  Regarding your lacerations, these were repaired with glue.  Avoid submerging your face and water.  The glue will loosen on its own.

## 2017-11-26 NOTE — ED Notes (Signed)
Patient c/o nausea at present; requests beverage; apple juice and ice water provided.

## 2017-11-26 NOTE — Progress Notes (Signed)
Per Dr. Kenney Housemanrab surgery will need to be rescheduled until tomorrow. Bedside PAT visit completed with patient signing consent.

## 2017-11-26 NOTE — ED Notes (Signed)
Pt given d/c instructions as per chart. Rx x 1 with precautions. Verbalizes understanding. No questions. 

## 2017-11-27 ENCOUNTER — Encounter (HOSPITAL_COMMUNITY): Payer: Self-pay

## 2017-11-27 ENCOUNTER — Ambulatory Visit (HOSPITAL_COMMUNITY)
Admission: RE | Admit: 2017-11-27 | Discharge: 2017-11-27 | Disposition: A | Discharge status: Home / Self Care | Payer: Self-pay | Source: Ambulatory Visit | Attending: Oral Surgery | Admitting: Oral Surgery

## 2017-11-27 ENCOUNTER — Encounter (HOSPITAL_COMMUNITY)
Admission: RE | Disposition: A | Discharge status: Home / Self Care | Payer: Self-pay | Source: Ambulatory Visit | Attending: Oral Surgery

## 2017-11-27 ENCOUNTER — Ambulatory Visit (HOSPITAL_COMMUNITY): Payer: Self-pay | Admitting: Certified Registered"

## 2017-11-27 DIAGNOSIS — Y9367 Activity, basketball: Secondary | ICD-10-CM | POA: Insufficient documentation

## 2017-11-27 DIAGNOSIS — Y9239 Other specified sports and athletic area as the place of occurrence of the external cause: Secondary | ICD-10-CM | POA: Insufficient documentation

## 2017-11-27 DIAGNOSIS — K029 Dental caries, unspecified: Secondary | ICD-10-CM | POA: Insufficient documentation

## 2017-11-27 DIAGNOSIS — F17210 Nicotine dependence, cigarettes, uncomplicated: Secondary | ICD-10-CM | POA: Insufficient documentation

## 2017-11-27 DIAGNOSIS — S02651B Fracture of angle of right mandible, initial encounter for open fracture: Secondary | ICD-10-CM | POA: Insufficient documentation

## 2017-11-27 DIAGNOSIS — W500XXA Accidental hit or strike by another person, initial encounter: Secondary | ICD-10-CM | POA: Insufficient documentation

## 2017-11-27 HISTORY — PX: ORIF MANDIBULAR FRACTURE: SHX2127

## 2017-11-27 SURGERY — OPEN REDUCTION INTERNAL FIXATION (ORIF) MANDIBULAR FRACTURE
Anesthesia: General | Site: Mouth | Laterality: Right

## 2017-11-27 MED ORDER — LIDOCAINE-EPINEPHRINE 1 %-1:100000 IJ SOLN
INTRAMUSCULAR | Status: AC
  Filled 2017-11-27: qty 1

## 2017-11-27 MED ORDER — ROCURONIUM BROMIDE 10 MG/ML (PF) SYRINGE
PREFILLED_SYRINGE | INTRAVENOUS | Status: AC
  Filled 2017-11-27: qty 10

## 2017-11-27 MED ORDER — METOCLOPRAMIDE HCL 5 MG/ML IJ SOLN
INTRAMUSCULAR | Status: AC
  Administered 2017-11-27: 10 mg via INTRAVENOUS
  Filled 2017-11-27: qty 2

## 2017-11-27 MED ORDER — OXYMETAZOLINE HCL 0.05 % NA SOLN
NASAL | Status: AC
  Filled 2017-11-27: qty 15

## 2017-11-27 MED ORDER — MIDAZOLAM HCL 2 MG/2ML IJ SOLN
INTRAMUSCULAR | Status: AC
  Filled 2017-11-27: qty 2

## 2017-11-27 MED ORDER — LIDOCAINE 2% (20 MG/ML) 5 ML SYRINGE
INTRAMUSCULAR | Status: AC
  Filled 2017-11-27: qty 5

## 2017-11-27 MED ORDER — BUPIVACAINE-EPINEPHRINE (PF) 0.25% -1:200000 IJ SOLN
INTRAMUSCULAR | Status: AC
  Filled 2017-11-27: qty 30

## 2017-11-27 MED ORDER — LACTATED RINGERS IV SOLN
INTRAVENOUS | Status: DC | PRN
  Administered 2017-11-27 (×2): via INTRAVENOUS

## 2017-11-27 MED ORDER — BACITRACIN ZINC 500 UNIT/GM EX OINT
TOPICAL_OINTMENT | CUTANEOUS | Status: AC
  Filled 2017-11-27: qty 28.35

## 2017-11-27 MED ORDER — DEXMEDETOMIDINE HCL IN NACL 200 MCG/50ML IV SOLN
INTRAVENOUS | Status: AC
  Filled 2017-11-27: qty 50

## 2017-11-27 MED ORDER — FENTANYL CITRATE (PF) 100 MCG/2ML IJ SOLN
INTRAMUSCULAR | Status: DC | PRN
  Administered 2017-11-27: 50 ug via INTRAVENOUS
  Administered 2017-11-27 (×2): 100 ug via INTRAVENOUS

## 2017-11-27 MED ORDER — ROCURONIUM BROMIDE 100 MG/10ML IV SOLN
INTRAVENOUS | Status: DC | PRN
  Administered 2017-11-27: 30 mg via INTRAVENOUS

## 2017-11-27 MED ORDER — HYDROMORPHONE HCL 1 MG/ML IJ SOLN: 0.2500 mg | INTRAMUSCULAR | Status: DC | PRN

## 2017-11-27 MED ORDER — 0.9 % SODIUM CHLORIDE (POUR BTL) OPTIME
TOPICAL | Status: DC | PRN
Start: 2017-11-27 — End: 2017-11-27
  Administered 2017-11-27: 1000 mL

## 2017-11-27 MED ORDER — LIDOCAINE-EPINEPHRINE 1 %-1:100000 IJ SOLN
INTRAMUSCULAR | Status: DC | PRN
  Administered 2017-11-27: 15 mL

## 2017-11-27 MED ORDER — MIDAZOLAM HCL 5 MG/5ML IJ SOLN
INTRAMUSCULAR | Status: DC | PRN
  Administered 2017-11-27: 2 mg via INTRAVENOUS

## 2017-11-27 MED ORDER — ONDANSETRON HCL 4 MG/2ML IJ SOLN
INTRAMUSCULAR | Status: AC
  Filled 2017-11-27: qty 2

## 2017-11-27 MED ORDER — PROMETHAZINE HCL 25 MG/ML IJ SOLN
INTRAMUSCULAR | Status: AC
  Filled 2017-11-27: qty 1

## 2017-11-27 MED ORDER — PROPOFOL 10 MG/ML IV BOLUS
INTRAVENOUS | Status: AC
  Filled 2017-11-27: qty 20

## 2017-11-27 MED ORDER — OXYCODONE HCL 5 MG PO TABS: 5.0000 mg | ORAL_TABLET | Freq: Once | ORAL | Status: DC | PRN

## 2017-11-27 MED ORDER — CEFAZOLIN SODIUM-DEXTROSE 2-4 GM/100ML-% IV SOLN
2.0000 g | Freq: Once | INTRAVENOUS | Status: AC
  Administered 2017-11-27: 2 g via INTRAVENOUS
  Filled 2017-11-27: qty 100

## 2017-11-27 MED ORDER — PROMETHAZINE HCL 25 MG/ML IJ SOLN
6.2500 mg | INTRAMUSCULAR | Status: DC | PRN
  Administered 2017-11-27: 6.25 mg via INTRAVENOUS

## 2017-11-27 MED ORDER — DEXMEDETOMIDINE HCL IN NACL 200 MCG/50ML IV SOLN
INTRAVENOUS | Status: DC | PRN
  Administered 2017-11-27: 8 ug via INTRAVENOUS
  Administered 2017-11-27: 32 ug via INTRAVENOUS
  Administered 2017-11-27: 8 ug via INTRAVENOUS

## 2017-11-27 MED ORDER — METOCLOPRAMIDE HCL 5 MG/ML IJ SOLN
10.0000 mg | Freq: Once | INTRAMUSCULAR | Status: AC | PRN
  Administered 2017-11-27: 10 mg via INTRAVENOUS

## 2017-11-27 MED ORDER — SUGAMMADEX SODIUM 200 MG/2ML IV SOLN
INTRAVENOUS | Status: DC | PRN
  Administered 2017-11-27: 150 mg via INTRAVENOUS

## 2017-11-27 MED ORDER — LIDOCAINE 2% (20 MG/ML) 5 ML SYRINGE
INTRAMUSCULAR | Status: DC | PRN
  Administered 2017-11-27: 60 mg via INTRAVENOUS

## 2017-11-27 MED ORDER — GLYCOPYRROLATE PF 0.2 MG/ML IJ SOSY
PREFILLED_SYRINGE | INTRAMUSCULAR | Status: DC | PRN
  Administered 2017-11-27: .1 mg via INTRAVENOUS

## 2017-11-27 MED ORDER — ONDANSETRON HCL 4 MG/2ML IJ SOLN
INTRAMUSCULAR | Status: DC | PRN
  Administered 2017-11-27: 4 mg via INTRAVENOUS

## 2017-11-27 MED ORDER — FENTANYL CITRATE (PF) 250 MCG/5ML IJ SOLN
INTRAMUSCULAR | Status: AC
  Filled 2017-11-27: qty 5

## 2017-11-27 MED ORDER — PROPOFOL 10 MG/ML IV BOLUS
INTRAVENOUS | Status: DC | PRN
  Administered 2017-11-27: 200 mg via INTRAVENOUS

## 2017-11-27 MED ORDER — GLYCOPYRROLATE PF 0.2 MG/ML IJ SOSY
PREFILLED_SYRINGE | INTRAMUSCULAR | Status: AC
  Filled 2017-11-27: qty 1

## 2017-11-27 MED ORDER — DEXAMETHASONE SODIUM PHOSPHATE 10 MG/ML IJ SOLN
INTRAMUSCULAR | Status: AC
  Filled 2017-11-27: qty 1

## 2017-11-27 MED ORDER — OXYCODONE HCL 5 MG/5ML PO SOLN: 5.0000 mg | Freq: Once | ORAL | Status: DC | PRN

## 2017-11-27 MED ORDER — DEXAMETHASONE SODIUM PHOSPHATE 10 MG/ML IJ SOLN
10.0000 mg | Freq: Once | INTRAMUSCULAR | Status: AC
  Administered 2017-11-27: 10 mg via INTRAVENOUS
  Filled 2017-11-27: qty 1

## 2017-11-27 MED ORDER — BUPIVACAINE-EPINEPHRINE 0.25% -1:200000 IJ SOLN
INTRAMUSCULAR | Status: DC | PRN
  Administered 2017-11-27: 20 mL

## 2017-11-27 MED ORDER — ONDANSETRON HCL 4 MG/2ML IJ SOLN
4.0000 mg | Freq: Once | INTRAMUSCULAR | Status: AC
  Administered 2017-11-27: 4 mg via INTRAVENOUS

## 2017-11-27 MED ORDER — OXYMETAZOLINE HCL 0.05 % NA SOLN
1.0000 | Freq: Two times a day (BID) | NASAL | Status: DC
  Administered 2017-11-27: 1 via NASAL
  Administered 2017-11-27 (×2): 2 via NASAL

## 2017-11-27 SURGICAL SUPPLY — 60 items
BIT DRILL 1.6X5 (BIT) ×1
BIT DRILL 1.6X5MM (BIT) ×1 IMPLANT
BIT DRILL RAINBOW 1.6X35 (BIT) ×2 IMPLANT
BIT DRILL TWIST 1.6X58MM (BIT) ×1 IMPLANT
BLADE SURG 15 STRL LF DISP TIS (BLADE) IMPLANT
BLADE SURG 15 STRL SS (BLADE)
CANISTER SUCT 3000ML PPV (MISCELLANEOUS) IMPLANT
CLEANER TIP ELECTROSURG 2X2 (MISCELLANEOUS) ×2 IMPLANT
DRAPE HALF SHEET 40X57 (DRAPES) IMPLANT
DRAPE ORTHO SPLIT 77X108 STRL (DRAPES) ×1
DRAPE SURG ORHT 6 SPLT 77X108 (DRAPES) ×1 IMPLANT
DRILL BIT 1.6X5MM (BIT) ×1
DRILL TWIST 1.6X58MM (BIT) ×2
ELECT COATED BLADE 2.86 ST (ELECTRODE) ×2 IMPLANT
ELECT REM PT RETURN 9FT ADLT (ELECTROSURGICAL) ×2
ELECTRODE REM PT RTRN 9FT ADLT (ELECTROSURGICAL) ×1 IMPLANT
GLOVE BIOGEL M 7.0 STRL (GLOVE) ×2 IMPLANT
GLOVE SURG SS PI 6.5 STRL IVOR (GLOVE) ×2 IMPLANT
GLOVE SURG SS PI 7.0 STRL IVOR (GLOVE) ×2 IMPLANT
GOWN STRL REUS W/ TWL LRG LVL3 (GOWN DISPOSABLE) ×2 IMPLANT
GOWN STRL REUS W/TWL LRG LVL3 (GOWN DISPOSABLE) ×2
KIT BASIN OR (CUSTOM PROCEDURE TRAY) ×2 IMPLANT
KIT TURNOVER KIT B (KITS) ×2 IMPLANT
NEEDLE BLUNT 18X1 FOR OR ONLY (NEEDLE) ×2 IMPLANT
NEEDLE HYPO 25GX1X1/2 BEV (NEEDLE) IMPLANT
NS IRRIG 1000ML POUR BTL (IV SOLUTION) ×2 IMPLANT
PAD ARMBOARD 7.5X6 YLW CONV (MISCELLANEOUS) ×4 IMPLANT
PATTIES SURGICAL .5 X3 (DISPOSABLE) IMPLANT
PENCIL BUTTON HOLSTER BLD 10FT (ELECTRODE) ×2 IMPLANT
PLATE MNDBLE 3D 4X2 SQUARE (Plate) ×2 IMPLANT
SCISSORS WIRE DISP (INSTRUMENTS) ×2 IMPLANT
SCREW BONE CROSS PIN 2.0X05MM (Screw) ×2 IMPLANT
SCREW MNDBLE 2.0X6 BONE (Screw) ×10 IMPLANT
SCREW MNDBLE 2.0X8 BONE (Screw) ×2 IMPLANT
SCREW MNDBLE 2.3X6MM BONE (Screw) ×2 IMPLANT
SCREW UPPER FACE 2.0X12MM (Screw) ×12 IMPLANT
SCREW UPPER FACE 2.0X8MM (Screw) ×4 IMPLANT
STAPLER VISISTAT 35W (STAPLE) IMPLANT
SUCTION FRAZIER TIP 10 FR DISP (SUCTIONS) IMPLANT
SUCTION FRAZIER TIP 8 FR DISP (SUCTIONS)
SUCTION TUBE FRAZIER 8FR DISP (SUCTIONS) IMPLANT
SUT BONE WAX W31G (SUTURE) IMPLANT
SUT CHROMIC 3 0 SH 27 (SUTURE) ×4 IMPLANT
SUT ETHILON 3 0 PS 1 (SUTURE) IMPLANT
SUT PROLENE 5 0 PS 2 (SUTURE) ×2 IMPLANT
SUT SILK 3 0 (SUTURE)
SUT SILK 3 0 SH 30 (SUTURE) IMPLANT
SUT SILK 3-0 18XBRD TIE 12 (SUTURE) IMPLANT
SUT STEEL 2 (SUTURE) ×2 IMPLANT
SUT STEEL 4 (SUTURE) ×2 IMPLANT
SUT VIC AB 3-0 FS2 27 (SUTURE) IMPLANT
SUT VIC AB 4-0 P-3 18X BRD (SUTURE) IMPLANT
SUT VIC AB 4-0 P3 18 (SUTURE)
SUT VIC AB 5-0 P-3 18XBRD (SUTURE) IMPLANT
SUT VIC AB 5-0 P3 18 (SUTURE)
SYR 50ML LL SCALE MARK (SYRINGE) ×2 IMPLANT
SYR CONTROL 10ML LL (SYRINGE) ×2 IMPLANT
TRAY ENT MC OR (CUSTOM PROCEDURE TRAY) ×2 IMPLANT
WATER STERILE IRR 1000ML POUR (IV SOLUTION) IMPLANT
YANKAUER SUCT BULB TIP NO VENT (SUCTIONS) ×2 IMPLANT

## 2017-11-27 NOTE — Anesthesia Procedure Notes (Signed)
Procedure Name: Intubation Date/Time: 11/27/2017 5:44 PM Performed by: Orlie Dakin, CRNA Pre-anesthesia Checklist: Patient identified, Emergency Drugs available, Suction available, Patient being monitored and Timeout performed Patient Re-evaluated:Patient Re-evaluated prior to induction Oxygen Delivery Method: Circle system utilized Preoxygenation: Pre-oxygenation with 100% oxygen Induction Type: IV induction Ventilation: Mask ventilation without difficulty Laryngoscope Size: Mac and 4 Grade View: Grade II Nasal Tubes: Right, Nasal prep performed, Nasal Rae and Magill forceps- large, utilized Tube size: 7.5 mm Number of attempts: 1 Placement Confirmation: ETT inserted through vocal cords under direct vision,  breath sounds checked- equal and bilateral and positive ETCO2 Secured at: 27 cm Tube secured with: Tape Dental Injury: Teeth and Oropharynx as per pre-operative assessment  Comments: Afrin spray to bilat nare, right nare with 32 FR nasal airway.  NTT passed without difficulty.  Anterior glottic opening noted

## 2017-11-27 NOTE — Anesthesia Postprocedure Evaluation (Signed)
Anesthesia Post Note  Patient: Shawn Flynn  Procedure(s) Performed: OPEN REDUCTION INTERNAL FIXATION (ORIF) MANDIBULAR FRACTURE (Right Mouth)     Patient location during evaluation: PACU Anesthesia Type: General Level of consciousness: sedated and patient cooperative Pain management: pain level controlled Vital Signs Assessment: post-procedure vital signs reviewed and stable Respiratory status: spontaneous breathing Cardiovascular status: stable Anesthetic complications: no    Last Vitals:  Vitals:   11/27/17 2025 11/27/17 2040  BP: (!) 128/91 (!) 137/94  Pulse: 74   Resp: (!) 9   Temp:    SpO2: 95%     Last Pain:  Vitals:   11/27/17 2040  TempSrc:   PainSc: Asleep                 Lewie LoronJohn Alessia Gonsalez

## 2017-11-27 NOTE — Interval H&P Note (Signed)
History and Physical Interval Note:  11/27/2017 9:52 PM  Shawn Flynn  has presented today for surgery, with the diagnosis of mandibular fracture  The various methods of treatment have been discussed with the patient and family. After consideration of risks, benefits and other options for treatment, the patient has consented to  Procedure(s): OPEN REDUCTION INTERNAL FIXATION (ORIF) MANDIBULAR FRACTURE (Right) as a surgical intervention .  The patient's history has been reviewed, patient examined, no change in status, stable for surgery.  I have reviewed the patient's chart and labs.  Questions were answered to the patient's satisfaction.     Vivia EwingJustin Avayah Raffety, DMD

## 2017-11-27 NOTE — Anesthesia Preprocedure Evaluation (Addendum)
Anesthesia Evaluation  Patient identified by MRN, date of birth, ID band Patient awake    Reviewed: Allergy & Precautions, NPO status , Patient's Chart, lab work & pertinent test results  Airway Mallampati: IV  TM Distance: >3 FB Neck ROM: Full  Mouth opening: Limited Mouth Opening  Dental no notable dental hx.    Pulmonary Current Smoker,    Pulmonary exam normal breath sounds clear to auscultation       Cardiovascular negative cardio ROS Normal cardiovascular exam Rhythm:Regular Rate:Normal     Neuro/Psych negative neurological ROS  negative psych ROS   GI/Hepatic negative GI ROS, Neg liver ROS,   Endo/Other  negative endocrine ROS  Renal/GU negative Renal ROS     Musculoskeletal negative musculoskeletal ROS (+)   Abdominal   Peds  Hematology negative hematology ROS (+)   Anesthesia Other Findings Mandibular fracture  Reproductive/Obstetrics                            Anesthesia Physical Anesthesia Plan  ASA: II  Anesthesia Plan: General   Post-op Pain Management:    Induction: Intravenous  PONV Risk Score and Plan: 1 and Ondansetron, Dexamethasone, Midazolam and Treatment may vary due to age or medical condition  Airway Management Planned: Nasal ETT  Additional Equipment:   Intra-op Plan:   Post-operative Plan: Extubation in OR  Informed Consent: I have reviewed the patients History and Physical, chart, labs and discussed the procedure including the risks, benefits and alternatives for the proposed anesthesia with the patient or authorized representative who has indicated his/her understanding and acceptance.   Dental advisory given  Plan Discussed with: CRNA  Anesthesia Plan Comments:         Anesthesia Quick Evaluation

## 2017-11-27 NOTE — Brief Op Note (Signed)
11/27/2017  8:06 PM  PATIENT:  Shawn Flynn  22 y.o. male  PRE-OPERATIVE DIAGNOSIS:  Open mandibular fracture  POST-OPERATIVE DIAGNOSIS:  Open mandibular fracture  PROCEDURE:  1. ORIF right mandibular angle fracture with maxillomandibular fixation 2. Surgical removal of teeth [phone]#15,31,32  SURGEON:  Surgeon(s) and Role:    * Saudia Smyser, DMD - Primary   ANESTHESIA:   general  EBL:  40 mL   BLOOD ADMINISTERED:none  DRAINS: none   LOCAL MEDICATIONS USED:  LIDOCAINE   SPECIMEN:  No Specimen  DISPOSITION OF SPECIMEN:  N/A  COUNTS:  YES  TOURNIQUET:  * No tourniquets in log *  DICTATION: .Dragon Dictation  PLAN OF CARE: Discharge to home after PACU  PATIENT DISPOSITION:  PACU - hemodynamically stable.   Delay start of Pharmacological VTE agent (>24hrs) due to surgical blood loss or risk of bleeding: not applicable

## 2017-11-27 NOTE — Transfer of Care (Signed)
Immediate Anesthesia Transfer of Care Note  Patient: Shawn Flynn  Procedure(s) Performed: OPEN REDUCTION INTERNAL FIXATION (ORIF) MANDIBULAR FRACTURE (Right Mouth)  Patient Location: PACU  Anesthesia Type:General  Level of Consciousness: drowsy, patient cooperative and responds to stimulation  Airway & Oxygen Therapy: Patient Spontanous Breathing  Post-op Assessment: Report given to RN and Post -op Vital signs reviewed and stable  Post vital signs: Reviewed and stable  Last Vitals:  Vitals Value Taken Time  BP    Temp    Pulse 65 11/27/2017  8:11 PM  Resp    SpO2 99 % 11/27/2017  8:11 PM  Vitals shown include unvalidated device data.  Last Pain:  Vitals:   11/27/17 1713  TempSrc: Oral  PainSc: 0-No pain         Complications: No apparent anesthesia complications

## 2017-11-27 NOTE — Op Note (Signed)
PATIENT:  Shawn Dandyimothy J Flynn  22 y.o. male  PRE-OPERATIVE DIAGNOSIS:  Open mandibular fracture  POST-OPERATIVE DIAGNOSIS:  Open mandibular fracture  PROCEDURE:  1. ORIF right mandibular angle fracture with maxillomandibular fixation 2. Surgical removal of teeth [phone]#15,31,32  SURGEON:  Surgeon(s) and Role:    * Maloni Musleh, DMD - Primary   ANESTHESIA:   general  EBL:  40 mL   COMPLICATIONS: None  Operative Findings:  1. Grossly carious teeth #15, 31,32 2. Adequate reduction in MMF 3. Mandible stable following fixation and reinforced with MMF  Implants: 1. Stryker IMF screws x8 2. Stryker 8-hole rectangle plate 3. 0.9WJ2.0mm Monocortical screws x8  Indication for procedure:  Patient is a 22 year old male status post blow to the face causing right mandibular fracture requiring surgical intervention.  He also had multiple grossly carious teeth #15,31,32 requiring removal.  Procedure: The patient was identified in the preoperative holding by both anesthesia and maxillofacial team.  Health history was reviewed.  Consent verified.  Patient brought to the operating placed in the table in supine position.  Standard ASA leads and monitors were placed.  Patient was preoxygenated, induced, and his airway was protected with a nasoendotracheal.  The tube was taped and secured by the anesthesia team.  A throat pack was placed, and  was prepped and draped for standard maxillofacial procedure.  Patient was injected with 10 cc of 1% lidocaine with 100,000 epinephrine in the bilateral maxilla and mandible.  Attention was directed to the left maxilla.  15 blade was used to make a sulcular incision.  Full-thickness flap was elevated.  Buccal bone was removed with a rongeur forcep.  Tooth #15 was luxated and extracted with a dental forcep.  The site was thoroughly curetted and irrigated.  The tissues were then reapproximated with 3-0 chromic gut suture.  Attention was then directed to the right posterior  mandible, where a 15 blade was used to make a sulcular incision with a distal fascial release.  Full-thickness flap was elevated to the facial to expose the lateral aspect of the body and ramus.  Tooth #31, 32 was luxated and extracted with a dental elevator.  The fracture in the right mandibular angle was evaluated, and curetted thoroughly.  The extraction sites were thoroughly curetted as well.  This area was irrigated with copious amounts of saline.  At this point Stryker intermaxillary fixation screws were then placed in the maxilla and mandible for a total of 8.  22-gauge wire was then utilized to place the patient in maxillomandibular fixation.  The occlusion was evaluated and deemed adequate.  The right mandibular angle fracture appeared adequately reduced.  A Stryker 8 hole rectangular plate was then passively adapted to the lateral aspect of the mandible.  A trocar was then placed in the right cheek in a standard fashion to include, a 15 blade stab incision in the right cheek with blunt dissection with a hemostat.  The trocar was then placed along with a cheek retractor.  The plate was then secured and fixated with multiple 2 mm monocortical screws.  The fixation was tested and noted to be stable.  The patient was then released from maxillomandibular fixation.  The occlusion was tested and was consistent/reproducible.  The right mandibular wound was then thoroughly irrigated.  The tissues were then reapproximated with multiple 3-0 chromic gut sutures placed in a continuous interlocking fashion.    At this point the entire oral cavity was thoroughly cleansed.  The throat pack was removed.  The patient was then placed back into maxillomandibular fixation with 22-gauge wire and use of the intermaxillary fixation screws.  Again the occlusion was evaluated and deemed adequate.  The right cheek trocar wound was then thoroughly cleansed, and closed with a single 5-0 Prolene suture placed in horizontal mattress  fashion.  The wound was then cleansed and covered with bacitracin and a Band-Aid.  The patient was then returned to the anesthesia care team where he was extubated without event.  He was transported to the postanesthesia care unit for recovery.  He will be discharged home once meeting appropriate discharge criteria.  Shawn Flynn, DMD Oral & Maxillofacial Surgery

## 2017-11-27 NOTE — Discharge Instructions (Addendum)
1. Full liquid diet until further notice 2. Patient already has Rx's for home use 3. Brush three times daily with soft bristled toothbrush

## 2017-11-28 ENCOUNTER — Encounter (HOSPITAL_COMMUNITY): Payer: Self-pay | Admitting: Oral Surgery

## 2018-05-22 ENCOUNTER — Encounter (HOSPITAL_BASED_OUTPATIENT_CLINIC_OR_DEPARTMENT_OTHER): Payer: Self-pay | Admitting: *Deleted

## 2018-05-22 ENCOUNTER — Other Ambulatory Visit: Payer: Self-pay

## 2018-05-24 ENCOUNTER — Ambulatory Visit: Payer: Self-pay | Admitting: Oral Surgery

## 2018-05-28 ENCOUNTER — Other Ambulatory Visit: Payer: Self-pay

## 2018-05-28 ENCOUNTER — Ambulatory Visit (HOSPITAL_BASED_OUTPATIENT_CLINIC_OR_DEPARTMENT_OTHER): Payer: Self-pay | Admitting: Certified Registered"

## 2018-05-28 ENCOUNTER — Encounter (HOSPITAL_BASED_OUTPATIENT_CLINIC_OR_DEPARTMENT_OTHER)
Admission: RE | Disposition: A | Discharge status: Home / Self Care | Payer: Self-pay | Source: Home / Self Care | Attending: Oral Surgery

## 2018-05-28 ENCOUNTER — Ambulatory Visit (HOSPITAL_BASED_OUTPATIENT_CLINIC_OR_DEPARTMENT_OTHER)
Admission: RE | Admit: 2018-05-28 | Discharge: 2018-05-28 | Disposition: A | Discharge status: Home / Self Care | Payer: Self-pay | Attending: Oral Surgery | Admitting: Oral Surgery

## 2018-05-28 ENCOUNTER — Encounter (HOSPITAL_BASED_OUTPATIENT_CLINIC_OR_DEPARTMENT_OTHER): Payer: Self-pay | Admitting: *Deleted

## 2018-05-28 DIAGNOSIS — L988 Other specified disorders of the skin and subcutaneous tissue: Secondary | ICD-10-CM | POA: Insufficient documentation

## 2018-05-28 DIAGNOSIS — T8469XA Infection and inflammatory reaction due to internal fixation device of other site, initial encounter: Secondary | ICD-10-CM | POA: Insufficient documentation

## 2018-05-28 DIAGNOSIS — F17210 Nicotine dependence, cigarettes, uncomplicated: Secondary | ICD-10-CM | POA: Insufficient documentation

## 2018-05-28 DIAGNOSIS — X58XXXA Exposure to other specified factors, initial encounter: Secondary | ICD-10-CM | POA: Insufficient documentation

## 2018-05-28 HISTORY — PX: MANDIBULAR HARDWARE REMOVAL: SHX5205

## 2018-05-28 SURGERY — REMOVAL, HARDWARE, MANDIBLE
Anesthesia: General | Site: Face | Laterality: Right

## 2018-05-28 MED ORDER — BSS IO SOLN
INTRAOCULAR | Status: AC
  Filled 2018-05-28: qty 15

## 2018-05-28 MED ORDER — DEXMEDETOMIDINE HCL 200 MCG/2ML IV SOLN
INTRAVENOUS | Status: DC | PRN
  Administered 2018-05-28 (×4): 4 ug via INTRAVENOUS

## 2018-05-28 MED ORDER — CEFAZOLIN SODIUM-DEXTROSE 2-4 GM/100ML-% IV SOLN
INTRAVENOUS | Status: AC
  Filled 2018-05-28: qty 100

## 2018-05-28 MED ORDER — CEFAZOLIN SODIUM-DEXTROSE 2-4 GM/100ML-% IV SOLN: 2.0000 g | INTRAVENOUS | Status: DC

## 2018-05-28 MED ORDER — DEXMEDETOMIDINE HCL IN NACL 200 MCG/50ML IV SOLN
INTRAVENOUS | Status: AC
  Filled 2018-05-28: qty 50

## 2018-05-28 MED ORDER — FENTANYL CITRATE (PF) 100 MCG/2ML IJ SOLN
25.0000 ug | INTRAMUSCULAR | Status: DC | PRN
  Administered 2018-05-28: 50 ug via INTRAVENOUS

## 2018-05-28 MED ORDER — FENTANYL CITRATE (PF) 100 MCG/2ML IJ SOLN
50.0000 ug | INTRAMUSCULAR | Status: AC | PRN
  Administered 2018-05-28 (×4): 50 ug via INTRAVENOUS

## 2018-05-28 MED ORDER — LIDOCAINE 2% (20 MG/ML) 5 ML SYRINGE
INTRAMUSCULAR | Status: AC
  Filled 2018-05-28: qty 5

## 2018-05-28 MED ORDER — LIDOCAINE-EPINEPHRINE (PF) 1 %-1:200000 IJ SOLN
INTRAMUSCULAR | Status: AC
  Filled 2018-05-28: qty 30

## 2018-05-28 MED ORDER — SCOPOLAMINE 1 MG/3DAYS TD PT72: 1.0000 | MEDICATED_PATCH | Freq: Once | TRANSDERMAL | Status: DC | PRN

## 2018-05-28 MED ORDER — FENTANYL CITRATE (PF) 100 MCG/2ML IJ SOLN
INTRAMUSCULAR | Status: AC
  Filled 2018-05-28: qty 2

## 2018-05-28 MED ORDER — OXYMETAZOLINE HCL 0.05 % NA SOLN
NASAL | Status: AC
  Filled 2018-05-28: qty 15

## 2018-05-28 MED ORDER — SUGAMMADEX SODIUM 200 MG/2ML IV SOLN
INTRAVENOUS | Status: AC
  Filled 2018-05-28: qty 2

## 2018-05-28 MED ORDER — GLYCOPYRROLATE PF 0.2 MG/ML IJ SOSY
PREFILLED_SYRINGE | INTRAMUSCULAR | Status: AC
  Filled 2018-05-28: qty 1

## 2018-05-28 MED ORDER — LACTATED RINGERS IV SOLN
INTRAVENOUS | Status: DC
  Administered 2018-05-28 (×2): via INTRAVENOUS

## 2018-05-28 MED ORDER — BUPIVACAINE-EPINEPHRINE (PF) 0.25% -1:200000 IJ SOLN
INTRAMUSCULAR | Status: DC | PRN
  Administered 2018-05-28: 8 mL

## 2018-05-28 MED ORDER — GLYCOPYRROLATE 0.2 MG/ML IJ SOLN
INTRAMUSCULAR | Status: DC | PRN
  Administered 2018-05-28: 0.1 mg via INTRAVENOUS

## 2018-05-28 MED ORDER — LIDOCAINE-EPINEPHRINE 1 %-1:100000 IJ SOLN
INTRAMUSCULAR | Status: DC | PRN
  Administered 2018-05-28: 9 mL

## 2018-05-28 MED ORDER — DEXAMETHASONE SODIUM PHOSPHATE 10 MG/ML IJ SOLN
INTRAMUSCULAR | Status: AC
  Filled 2018-05-28: qty 1

## 2018-05-28 MED ORDER — CHLORHEXIDINE GLUCONATE 0.12 % MT SOLN: 15.0000 mL | Freq: Three times a day (TID) | OROMUCOSAL | 0 refills | Status: AC

## 2018-05-28 MED ORDER — MIDAZOLAM HCL 2 MG/2ML IJ SOLN
INTRAMUSCULAR | Status: AC
  Filled 2018-05-28: qty 2

## 2018-05-28 MED ORDER — ATROPINE SULFATE 0.4 MG/ML IJ SOLN: INTRAMUSCULAR | Status: DC | PRN

## 2018-05-28 MED ORDER — OXYMETAZOLINE HCL 0.05 % NA SOLN
NASAL | Status: DC | PRN
  Administered 2018-05-28: 1 via NASAL

## 2018-05-28 MED ORDER — MIDAZOLAM HCL 2 MG/2ML IJ SOLN
1.0000 mg | INTRAMUSCULAR | Status: DC | PRN
  Administered 2018-05-28: 2 mg via INTRAVENOUS

## 2018-05-28 MED ORDER — SUGAMMADEX SODIUM 200 MG/2ML IV SOLN
INTRAVENOUS | Status: DC | PRN
  Administered 2018-05-28: 150 mg via INTRAVENOUS

## 2018-05-28 MED ORDER — LIDOCAINE-EPINEPHRINE 2 %-1:100000 IJ SOLN
INTRAMUSCULAR | Status: AC
  Filled 2018-05-28: qty 1.7

## 2018-05-28 MED ORDER — OXYCODONE HCL 5 MG/5ML PO SOLN: 5.0000 mg | Freq: Once | ORAL | Status: DC | PRN

## 2018-05-28 MED ORDER — OXYCODONE HCL 5 MG PO TABS: 5.0000 mg | ORAL_TABLET | Freq: Once | ORAL | Status: DC | PRN

## 2018-05-28 MED ORDER — ROCURONIUM BROMIDE 50 MG/5ML IV SOSY
PREFILLED_SYRINGE | INTRAVENOUS | Status: AC
  Filled 2018-05-28: qty 5

## 2018-05-28 MED ORDER — ONDANSETRON HCL 4 MG/2ML IJ SOLN
INTRAMUSCULAR | Status: DC | PRN
  Administered 2018-05-28: 4 mg via INTRAVENOUS

## 2018-05-28 MED ORDER — BUPIVACAINE-EPINEPHRINE (PF) 0.25% -1:200000 IJ SOLN
INTRAMUSCULAR | Status: AC
  Filled 2018-05-28: qty 30

## 2018-05-28 MED ORDER — BACITRACIN ZINC 500 UNIT/GM EX OINT
TOPICAL_OINTMENT | CUTANEOUS | Status: AC
  Filled 2018-05-28: qty 1.8

## 2018-05-28 MED ORDER — ROCURONIUM BROMIDE 100 MG/10ML IV SOLN
INTRAVENOUS | Status: DC | PRN
  Administered 2018-05-28: 50 mg via INTRAVENOUS

## 2018-05-28 MED ORDER — LIDOCAINE 2% (20 MG/ML) 5 ML SYRINGE
INTRAMUSCULAR | Status: DC | PRN
  Administered 2018-05-28: 80 mg via INTRAVENOUS

## 2018-05-28 MED ORDER — BACITRACIN ZINC 500 UNIT/GM EX OINT
TOPICAL_OINTMENT | CUTANEOUS | Status: DC | PRN
  Administered 2018-05-28: 1 via TOPICAL

## 2018-05-28 MED ORDER — FENTANYL CITRATE (PF) 100 MCG/2ML IJ SOLN
INTRAMUSCULAR | Status: AC
  Filled 2018-05-28: qty 4

## 2018-05-28 MED ORDER — CLINDAMYCIN PHOSPHATE 600 MG/50ML IV SOLN
INTRAVENOUS | Status: DC | PRN
  Administered 2018-05-28: 600 mg via INTRAVENOUS

## 2018-05-28 MED ORDER — PROPOFOL 10 MG/ML IV BOLUS
INTRAVENOUS | Status: AC
  Filled 2018-05-28: qty 20

## 2018-05-28 MED ORDER — CLINDAMYCIN HCL 300 MG PO CAPS: 300.0000 mg | ORAL_CAPSULE | Freq: Four times a day (QID) | ORAL | 0 refills | Status: AC

## 2018-05-28 MED ORDER — PROPOFOL 10 MG/ML IV BOLUS
INTRAVENOUS | Status: DC | PRN
  Administered 2018-05-28: 150 mg via INTRAVENOUS
  Administered 2018-05-28: 50 mg via INTRAVENOUS

## 2018-05-28 MED ORDER — CLINDAMYCIN PHOSPHATE 600 MG/50ML IV SOLN
INTRAVENOUS | Status: AC
  Filled 2018-05-28: qty 50

## 2018-05-28 MED ORDER — DEXAMETHASONE SODIUM PHOSPHATE 10 MG/ML IJ SOLN
INTRAMUSCULAR | Status: DC | PRN
  Administered 2018-05-28: 10 mg via INTRAVENOUS

## 2018-05-28 MED ORDER — ONDANSETRON HCL 4 MG/2ML IJ SOLN: 4.0000 mg | Freq: Once | INTRAMUSCULAR | Status: DC | PRN

## 2018-05-28 MED ORDER — ONDANSETRON HCL 4 MG/2ML IJ SOLN
INTRAMUSCULAR | Status: AC
  Filled 2018-05-28: qty 2

## 2018-05-28 SURGICAL SUPPLY — 61 items
BLADE SURG 15 STRL LF DISP TIS (BLADE) ×4 IMPLANT
BLADE SURG 15 STRL SS (BLADE) ×4
CANISTER SUCT 1200ML W/VALVE (MISCELLANEOUS) ×4 IMPLANT
CANISTER SUCTION 1200CC (MISCELLANEOUS) ×4 IMPLANT
CLEANER CAUTERY TIP 5X5 PAD (MISCELLANEOUS) IMPLANT
COVER BACK TABLE 60X90IN (DRAPES) ×4 IMPLANT
COVER MAYO STAND STRL (DRAPES) ×4 IMPLANT
COVER WAND RF STERILE (DRAPES) IMPLANT
DRAPE EENT ADH APERT 31X51 STR (DRAPES) IMPLANT
DRAPE HALF SHEET 40X57 (DRAPES) IMPLANT
DRAPE U-SHAPE 76X120 STRL (DRAPES) ×4 IMPLANT
ELECT COATED BLADE 2.86 ST (ELECTRODE) IMPLANT
ELECT NEEDLE TIP 2.8 STRL (NEEDLE) ×4 IMPLANT
ELECT REM PT RETURN 9FT ADLT (ELECTROSURGICAL) ×4
ELECTRODE REM PT RTRN 9FT ADLT (ELECTROSURGICAL) ×2 IMPLANT
GLOVE BIO SURGEON STRL SZ 6.5 (GLOVE) ×6 IMPLANT
GLOVE BIO SURGEONS STRL SZ 6.5 (GLOVE) ×2
GLOVE ORTHO TXT STRL SZ7.5 (GLOVE) ×4 IMPLANT
GOWN STRL REUS W/ TWL LRG LVL3 (GOWN DISPOSABLE) ×6 IMPLANT
GOWN STRL REUS W/TWL LRG LVL3 (GOWN DISPOSABLE) ×6
NEEDLE PRECISIONGLIDE 27X1.5 (NEEDLE) ×4 IMPLANT
NS IRRIG 1000ML POUR BTL (IV SOLUTION) ×4 IMPLANT
PACK BASIN DAY SURGERY FS (CUSTOM PROCEDURE TRAY) ×4 IMPLANT
PAD CLEANER CAUTERY TIP 5X5 (MISCELLANEOUS)
PENCIL BUTTON HOLSTER BLD 10FT (ELECTRODE) ×4 IMPLANT
PENCIL FOOT CONTROL (ELECTRODE) IMPLANT
SCISSORS WIRE ANG 4 3/4 DISP (INSTRUMENTS) IMPLANT
SHEET MEDIUM DRAPE 40X70 STRL (DRAPES) ×4 IMPLANT
SLEEVE SCD COMPRESS KNEE MED (MISCELLANEOUS) ×4 IMPLANT
SUT BONE WAX W31G (SUTURE) IMPLANT
SUT CHROMIC 3 0 PS 2 (SUTURE) IMPLANT
SUT CHROMIC 4 0 PS 2 18 (SUTURE) IMPLANT
SUT CHROMIC 5 0 RB 1 27 (SUTURE) IMPLANT
SUT ETHILON 4 0 P 3 18 (SUTURE) IMPLANT
SUT ETHILON 5 0 P 3 18 (SUTURE)
SUT NYLON ETHILON 5-0 P-3 1X18 (SUTURE) IMPLANT
SUT PROLENE 5 0 P 3 (SUTURE) ×4 IMPLANT
SUT SILK 3 0 PS 1 (SUTURE) IMPLANT
SUT STEEL 0 (SUTURE)
SUT STEEL 0 18XMFL TIE 17 (SUTURE) IMPLANT
SUT STEEL 1 (SUTURE) ×4 IMPLANT
SUT STEEL 2 (SUTURE) IMPLANT
SUT VIC AB 3-0 PS1 18 (SUTURE) ×2
SUT VIC AB 3-0 PS1 18XBRD (SUTURE) ×2 IMPLANT
SUT VIC AB 4-0 P-3 18XBRD (SUTURE) ×2 IMPLANT
SUT VIC AB 4-0 P3 18 (SUTURE) ×2
SUT VIC AB 5-0 P-3 18X BRD (SUTURE) IMPLANT
SUT VIC AB 5-0 P3 18 (SUTURE)
SUT VICRYL 4-0 PS2 18IN ABS (SUTURE) IMPLANT
SWAB COLLECTION DEVICE MRSA (MISCELLANEOUS) ×4 IMPLANT
SWAB CULTURE ESWAB REG 1ML (MISCELLANEOUS) ×4 IMPLANT
SYR 50ML LL SCALE MARK (SYRINGE) ×8 IMPLANT
SYR CONTROL 10ML LL (SYRINGE) ×4 IMPLANT
TOOTHBRUSH ADULT (PERSONAL CARE ITEMS) ×4 IMPLANT
TOWEL GREEN STERILE FF (TOWEL DISPOSABLE) ×4 IMPLANT
TRAY DSU PREP LF (CUSTOM PROCEDURE TRAY) ×8 IMPLANT
TUBE CONNECTING 20'X1/4 (TUBING) ×1
TUBE CONNECTING 20X1/4 (TUBING) ×3 IMPLANT
TUBING IRRIGATION (MISCELLANEOUS) IMPLANT
WATER STERILE IRR 1000ML POUR (IV SOLUTION) ×4 IMPLANT
YANKAUER SUCT BULB TIP NO VENT (SUCTIONS) ×4 IMPLANT

## 2018-05-28 NOTE — Op Note (Signed)
PATIENT:  Shawn Flynn  23 y.o. male  PRE-OPERATIVE DIAGNOSIS:  INFECTED HARDWARE RIGHT MANDIBLE, FISTULA  POST-OPERATIVE DIAGNOSIS:  INFECTED HARDWARE RIGHT MANDIBLE, FISTULA  PROCEDURE:  Procedure(s): 1. RIGHT MANDIBULAR HARDWARE REMOVAL WITH DEBRIDEMENT 2. CLOSURE OF RIGHT CUTANEOUS FISTULA  SURGEON:  Surgeon(s) and Role:    * Uziel Covault, DMD - Primary  ASSISTANTS: Hadassah Paisae Carter, Meade MawLindsay Hargett   ANESTHESIA:   general  EBL:  50mL   COMPLICATIONS: none  SPECIMENS: none  OPERATIVE FINDINGS: 1. Multiple loose screws in fixation plate 2. 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 Previous mandibular fracture was stable, and appeared well healed. 4. Occlusion stable.  INDICATIONS FOR PROCEDURE: Patient is a 23 year old male approximately 6 months status post ORIF of right mandibular fracture with maxillomandibular fixation.  Patient has an extensive history of smoking 1 to 2 packs/day.  Approximately 2 weeks ago the patient called the office reporting of drainage noted from his right cheek.  He was brought in for further evaluation and noted the found infected hardware in the right posterior mandible with a cutaneous fistula with active purulence.  Because of this the patient will be taken to the operating room for removal of right mandibular hardware with closure of his right cutaneous cheek fistula.  Procedure: The patient was identified in preoperative holding by both anesthesia and the maxillofacial teams.  Health history was reviewed.  Consent was verified.  The patient was then brought back to the operating room and placed in the table in the supine position.  Standard ASA leads and monitors were placed.  The patient was preoxygenated, induced, and his airway was protected with a nasoendotracheal tube.  The tube was taped and secured by the anesthesia care team.  The patient was then prepped and draped for standard maxillofacial procedure.  A throat pack was placed.  The  patient was then injected with 8 cc of 2% lidocaine with 1:100000 epinephrine in the right mandible.  Antibiotics preoperatively were held in order to obtain cultures.  However, at this time there was no active purulence from his cutaneous fistula, and cultures were unable to be obtained.  The patient was then given 600 mg of IV clindamycin prior to incision.  Attention was first directed intraorally where a 15 blade was used to make a vestibular incision in the right posterior mandible.  A full-thickness flap was elevated and a subperiosteal dissection was completed.  This was done down to the inferior border to expose the right mandibular hardware where extensive granulation tissue was apparent.  There are multiple loose screws in the middle of the fixation plate.  These were removed easily with a periosteal elevator.  In order to gain access to the remaining screws, a trocar was placed in the right cheek in a standard fashion to allow for access to the screws.  The remaining screws and plate were then removed.  The lateral border of the mandible and the angle area was extensively debrided removing granulation tissue.  The bony contours were then smoothed with a bone file.  The site from the previous fractured area appeared to be well-healed. the mandible was stable, as was the occlusion.  At this point the entire intraoral cavity was then thoroughly irrigated.  The intraoral wound was then closed with 3-0 Vicryl sutures placed in a continuous interlocking fashion.  Attention was then directed extraorally, where necrotic tissue surrounding the cutaneous fistula was excised with a 15 blade in an elliptical fashion.  The wound was thoroughly  irrigated.  The deep tissues were first reapproximated with interrupted 4-0 Vicryl suture.  The skin was then reapproximated and closed primarily with multiple 5-0 Prolene sutures.  The separate trocar wound was closed with a single 5-0 Prolene suture.  At this point the  entire face was then thoroughly cleansed.  The throat pack was removed.  The extraoral wounds were then covered with a thin coat of bacitracin ointment.  The patient was then returned to the anesthesia care team where he was extubated without event.  He was transported to the postanesthesia care unit for recovery.  He will be discharged home once meeting appropriate discharge criteria.  Shawn Flynn, DMD Oral & Maxillofacial Surgery

## 2018-05-28 NOTE — Anesthesia Postprocedure Evaluation (Signed)
Anesthesia Post Note  Patient: Shawn Flynn  Procedure(s) Performed: MANDIBULAR HARDWARE REMOVAL, DEBRIDEMENT (Right Face)     Patient location during evaluation: PACU Anesthesia Type: General Level of consciousness: awake and alert Pain management: pain level controlled Vital Signs Assessment: post-procedure vital signs reviewed and stable Respiratory status: spontaneous breathing, nonlabored ventilation and respiratory function stable Cardiovascular status: blood pressure returned to baseline and stable Postop Assessment: no apparent nausea or vomiting Anesthetic complications: no    Last Vitals:  Vitals:   05/28/18 1100 05/28/18 1130  BP: 107/69 (!) 120/97  Pulse: (!) 56 69  Resp: 12 16  Temp:  36.6 C  SpO2: 98% 99%    Last Pain:  Vitals:   05/28/18 1130  TempSrc:   PainSc: Asleep                 Lucretia Kern

## 2018-05-28 NOTE — Interval H&P Note (Signed)
History and Physical Interval Note:  05/28/2018 8:15 AM  Shawn Dandyimothy J Flynn  has presented today for surgery, with the diagnosis of INFECTED HARDWARE RIGHT MANDIBLE, FISTULA  The various methods of treatment have been discussed with the patient and family. After consideration of risks, benefits and other options for treatment, the patient has consented to  Procedure(s): MANDIBULAR HARDWARE REMOVAL, DEBRIDEMENT (N/A) POSSIBLE CLOSED REDUCTION OF MANDIBULAR FRACTURE (N/A) as a surgical intervention .  The patient's history has been reviewed, patient examined, no change in status, stable for surgery.  I have reviewed the patient's chart and labs.  Questions were answered to the patient's satisfaction.     Shawn Flynn

## 2018-05-28 NOTE — Discharge Instructions (Addendum)
Regular, Mechanical soft diet until further notice. Bacitracin to extraoral wounds twice daily after cleaning with hydrogen peroxide/Water soln with q-tip applicator.      Post Anesthesia Home Care Instructions  Activity: Get plenty of rest for the remainder of the day. A responsible individual must stay with you for 24 hours following the procedure.  For the next 24 hours, DO NOT: -Drive a car -Advertising copywriterperate machinery -Drink alcoholic beverages -Take any medication unless instructed by your physician -Make any legal decisions or sign important papers.  Meals: Start with liquid foods such as gelatin or soup. Progress to regular foods as tolerated. Avoid greasy, spicy, heavy foods. If nausea and/or vomiting occur, drink only clear liquids until the nausea and/or vomiting subsides. Call your physician if vomiting continues.  Special Instructions/Symptoms: Your throat may feel dry or sore from the anesthesia or the breathing tube placed in your throat during surgery. If this causes discomfort, gargle with warm salt water. The discomfort should disappear within 24 hours.  If you had a scopolamine patch placed behind your ear for the management of post- operative nausea and/or vomiting:  1. The medication in the patch is effective for 72 hours, after which it should be removed.  Wrap patch in a tissue and discard in the trash. Wash hands thoroughly with soap and water. 2. You may remove the patch earlier than 72 hours if you experience unpleasant side effects which may include dry mouth, dizziness or visual disturbances. 3. Avoid touching the patch. Wash your hands with soap and water after contact with the patch.

## 2018-05-28 NOTE — Transfer of Care (Signed)
Immediate Anesthesia Transfer of Care Note  Patient: Shawn Flynn  Procedure(s) Performed: MANDIBULAR HARDWARE REMOVAL, DEBRIDEMENT (Right Face)  Patient Location: PACU  Anesthesia Type:General  Level of Consciousness: awake, alert , oriented and drowsy  Airway & Oxygen Therapy: Patient Spontanous Breathing  Post-op Assessment: Report given to RN and Post -op Vital signs reviewed and stable  Post vital signs: Reviewed and stable  Last Vitals:  Vitals Value Taken Time  BP    Temp    Pulse    Resp    SpO2      Last Pain:  Vitals:   05/28/18 0751  TempSrc: Oral  PainSc: 0-No pain         Complications: No apparent anesthesia complications

## 2018-05-28 NOTE — Anesthesia Preprocedure Evaluation (Addendum)
Anesthesia Evaluation  Patient identified by MRN, date of birth, ID band Patient awake    Reviewed: Allergy & Precautions, NPO status , Patient's Chart, lab work & pertinent test results  History of Anesthesia Complications Negative for: history of anesthetic complications  Airway Mallampati: II  TM Distance: >3 FB Neck ROM: Full    Dental no notable dental hx. (+) Teeth Intact, Dental Advisory Given   Pulmonary Current Smoker,    Pulmonary exam normal        Cardiovascular negative cardio ROS Normal cardiovascular exam     Neuro/Psych negative neurological ROS  negative psych ROS   GI/Hepatic negative GI ROS, Neg liver ROS,   Endo/Other  negative endocrine ROS  Renal/GU negative Renal ROS  negative genitourinary   Musculoskeletal negative musculoskeletal ROS (+)   Abdominal   Peds  Hematology negative hematology ROS (+)   Anesthesia Other Findings   Reproductive/Obstetrics                            Anesthesia Physical Anesthesia Plan  ASA: II  Anesthesia Plan: General   Post-op Pain Management:    Induction: Intravenous  PONV Risk Score and Plan: 1 and Ondansetron, Dexamethasone, Midazolam and Treatment may vary due to age or medical condition  Airway Management Planned: Nasal ETT  Additional Equipment: None  Intra-op Plan:   Post-operative Plan: Extubation in OR  Informed Consent: I have reviewed the patients History and Physical, chart, labs and discussed the procedure including the risks, benefits and alternatives for the proposed anesthesia with the patient or authorized representative who has indicated his/her understanding and acceptance.     Dental advisory given  Plan Discussed with:   Anesthesia Plan Comments:        Anesthesia Quick Evaluation

## 2018-05-28 NOTE — Anesthesia Procedure Notes (Signed)
Procedure Name: Intubation Date/Time: 05/28/2018 8:34 AM Performed by: Gwyndolyn Saxon, CRNA Pre-anesthesia Checklist: Patient identified, Emergency Drugs available, Suction available and Patient being monitored Patient Re-evaluated:Patient Re-evaluated prior to induction Oxygen Delivery Method: Circle system utilized Preoxygenation: Pre-oxygenation with 100% oxygen Induction Type: IV induction Ventilation: Mask ventilation without difficulty Laryngoscope Size: Mac and 3 Grade View: Grade II Nasal Tubes: Right, Nasal prep performed, Nasal Rae and Magill forceps- large, utilized Tube size: 7.0 mm Number of attempts: 1 Placement Confirmation: ETT inserted through vocal cords under direct vision,  positive ETCO2 and breath sounds checked- equal and bilateral Tube secured with: Tape Dental Injury: Teeth and Oropharynx as per pre-operative assessment

## 2018-05-28 NOTE — H&P (Signed)
  Shawn Flynn is an 23 y.o. male.   Chief Complaint: Jaw pain HPI: Patient is a 23 year old male status post ORIF right mandibular angle fracture in July 2019. Initial healing was uneventful. Patient is a smoker. 2 weeks ago the patient called complaining of purulence coming from his right cheek.  Currently the patient complains of right jaw pain, infection.  No dyspnea/dysphasia.  No headache/nausea vomiting.  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 arm surgery      No family history on file. Social History:  reports that he has been smoking cigarettes.  He has been smoking about 1-2.00 packs per day. He has never used smokeless tobacco. He reports that he does not drink alcohol or use drugs.  Allergies: No Known Allergies   ROS: other than HPI, neg  Vitals: 67", 60kg, 63, 130/90, 13, 98% RA  Physical Exam  Gen: Awake/alert, no acute distress HEENT: Pupils are equally round and reactive to light, eom intact.  Mild right facial edema, cutaneous fistula present with active purulence. Occlusion stable. Oropharynx clear.  Mucous membranes moist. Heart: RRR, nl s1,s2 Lungs: CTA-B Abd: s, nt, nd Neuro: Right CN V3 paresthesia   Assessment/Plan Patient a 23 year old male 6 months s/p ORIF right mand fx, now with infected hardware and cutaneous fistula. Plan for hardware removal, mandibular debridement, cutaneous fistula closure, possible MMF in the OR under general anesthesia.  Risk/benefits/alternatives of been discussed in detail with the patient.  He is in agreement with the plan.  We will plan to do this at [facility].  Anesthesia request: Nasoendotracheal tube intubation.  Vivia Ewing, DMD  Oral and maxillofacial surgery

## 2018-05-28 NOTE — Brief Op Note (Signed)
05/28/2018  9:47 AM  PATIENT:  Shawn Flynn  23 y.o. male  PRE-OPERATIVE DIAGNOSIS:  INFECTED HARDWARE RIGHT MANDIBLE, FISTULA  POST-OPERATIVE DIAGNOSIS:  INFECTED HARDWARE RIGHT MANDIBLE, FISTULA  PROCEDURE:  Procedure(s): MANDIBULAR HARDWARE REMOVAL, DEBRIDEMENT (Right)  SURGEON:  Surgeon(s) and Role:    * Shantice Menger, Jill Alexanders, DMD - Primary  ASSISTANTS: Hadassah Pais, Meade Maw   ANESTHESIA:   general  EBL:  63mL   BLOOD ADMINISTERED:none  DRAINS: none   LOCAL MEDICATIONS USED:  XYLOCAINE   SPECIMEN:  No Specimen  DISPOSITION OF SPECIMEN:  N/A  COUNTS:  YES  TOURNIQUET:  * No tourniquets in log *  DICTATION: .Dragon Dictation  PLAN OF CARE: Discharge to home after PACU  PATIENT DISPOSITION:  PACU - hemodynamically stable.   Delay start of Pharmacological VTE agent (>24hrs) due to surgical blood loss or risk of bleeding: not applicable

## 2018-05-29 ENCOUNTER — Encounter (HOSPITAL_BASED_OUTPATIENT_CLINIC_OR_DEPARTMENT_OTHER): Payer: Self-pay | Admitting: Oral Surgery

## 2019-07-25 IMAGING — CT CT MAXILLOFACIAL W/O CM
3 of 4 series · 16 of 47 positions shown, 19 images · non-contrast
Comparison: None.

CLINICAL DATA: Initial evaluation for acute trauma, right facial
laceration.

EXAM:
CT MAXILLOFACIAL WITHOUT CONTRAST
TECHNIQUE: Multidetector CT imaging of the maxillofacial structures was
performed. Multiplanar CT image reconstructions were also generated.

[Series 3: max bone · axial · 0.36mm/px · z∈[-152,-8]mm · 10 of 84 slices shown, 13 images]
[im 6/84  brain]
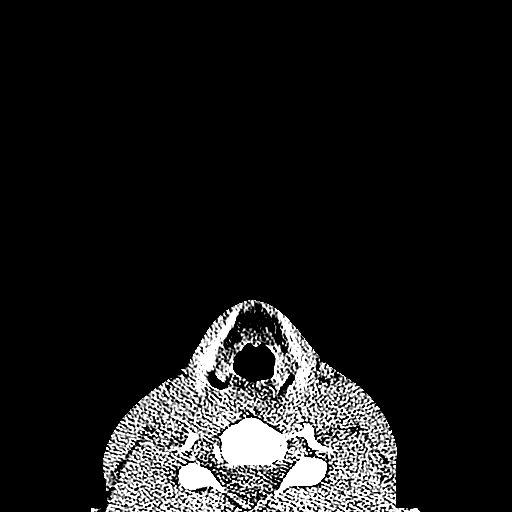
[im 6/84  bone]
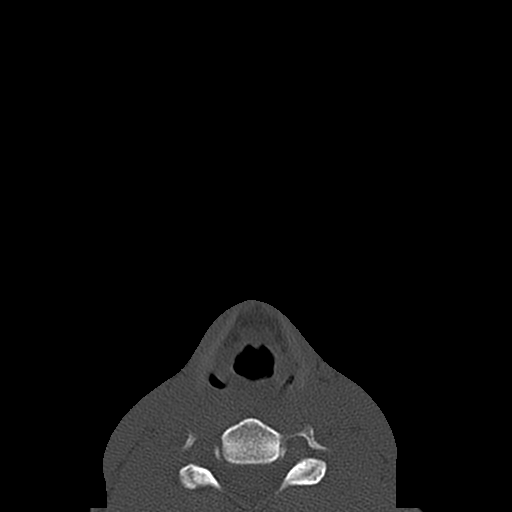
[im 12/84  bone]
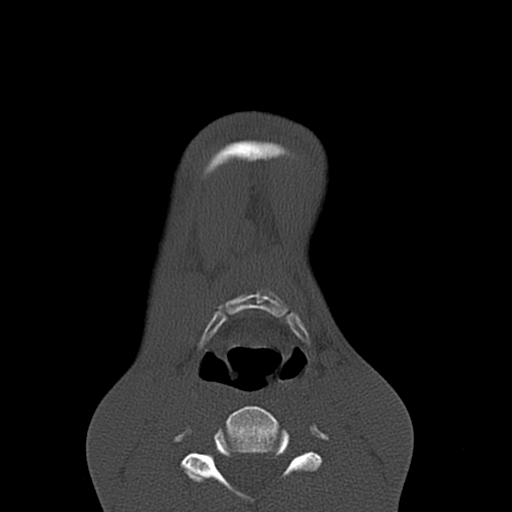
[im 24/84  bone]
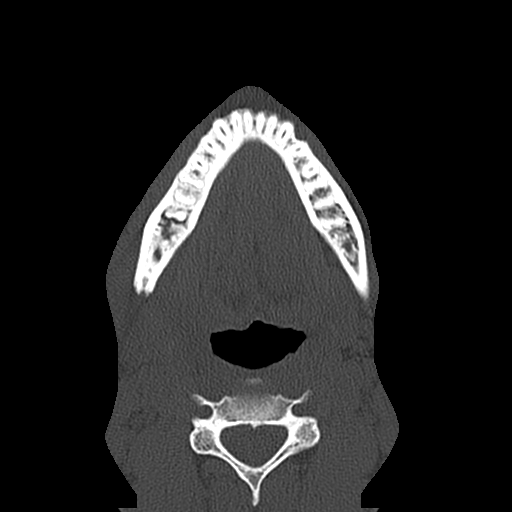
[im 30/84  bone]
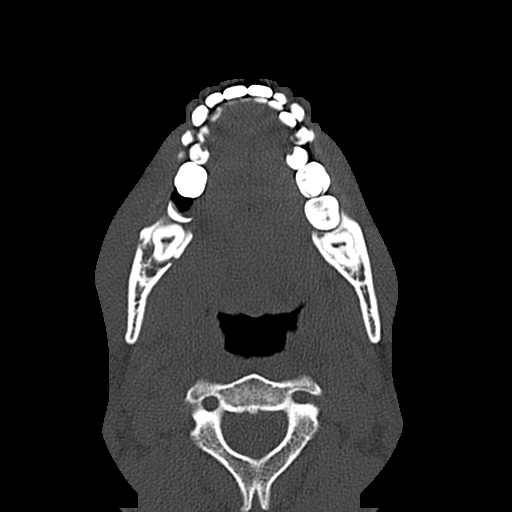
[im 36/84  brain]
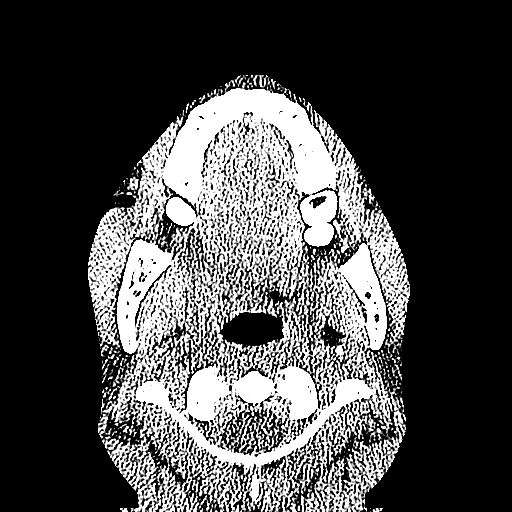
[im 36/84  bone]
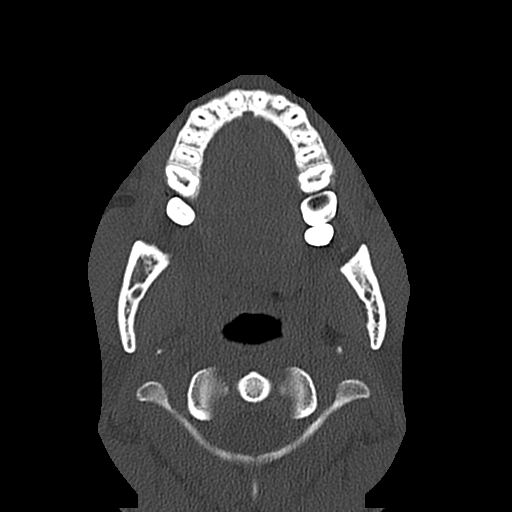
[im 48/84  bone]
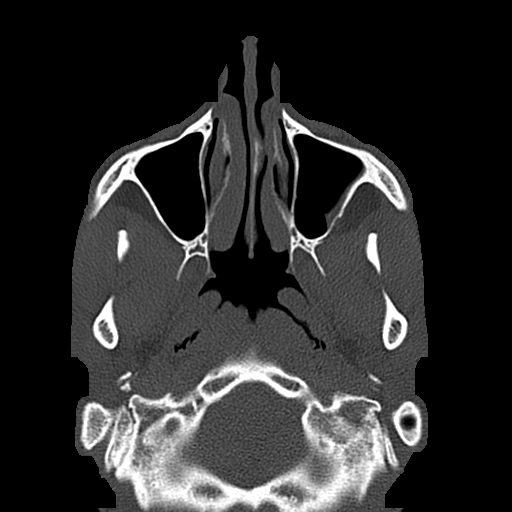
[im 54/84  bone]
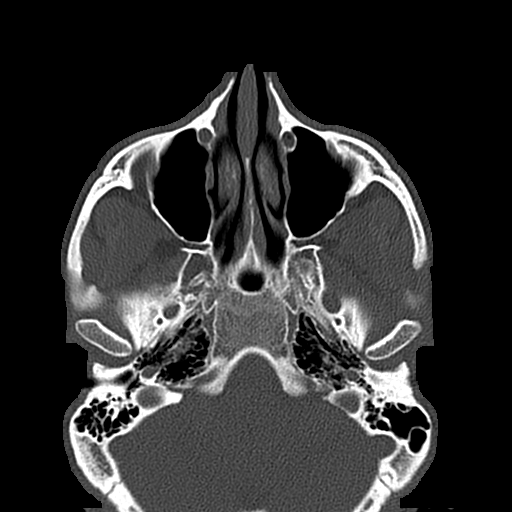
[im 60/84  bone]
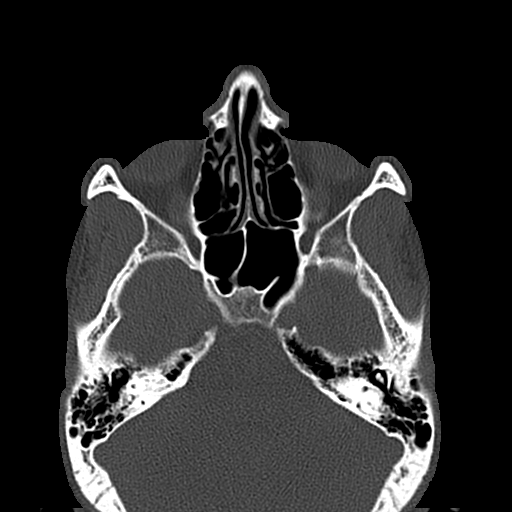
[im 72/84  brain]
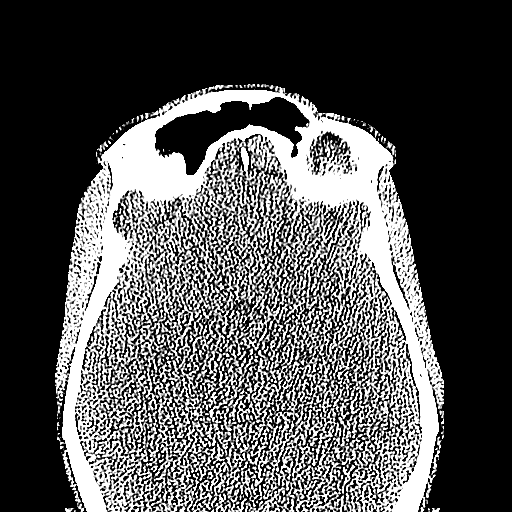
[im 72/84  bone]
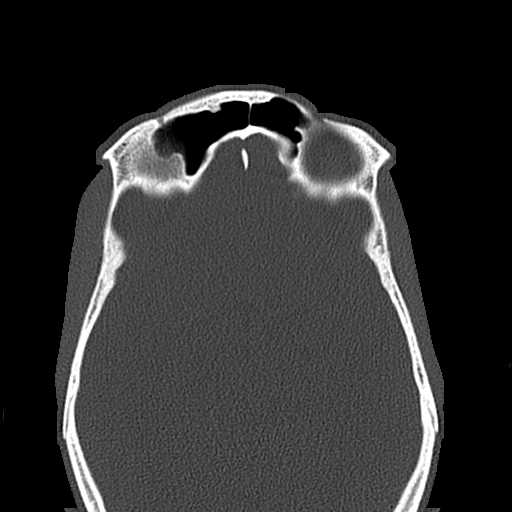
[im 78/84  bone]
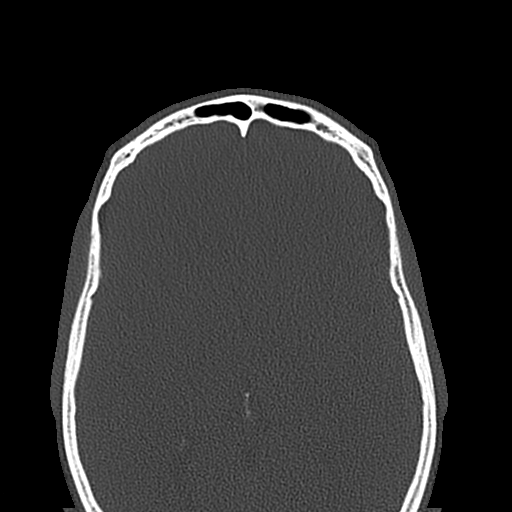

[Series 6: coronal soft · coronal · 0.31mm/px · 3 of 77 slices shown]
[im 26/77  bone]
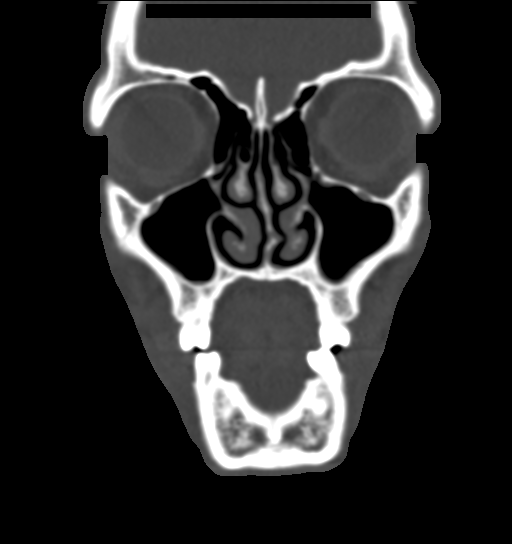
[im 34/77  bone]
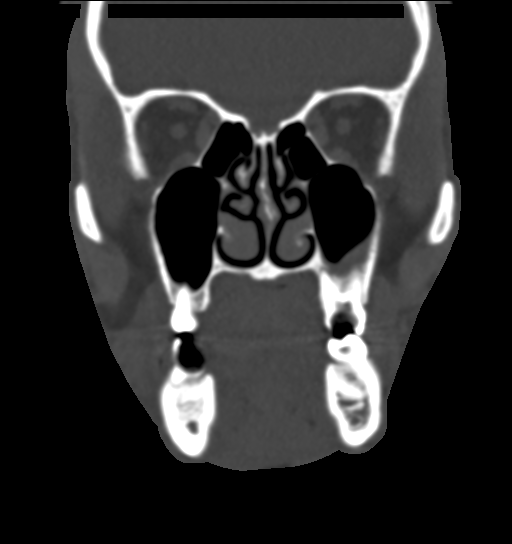
[im 43/77  bone]
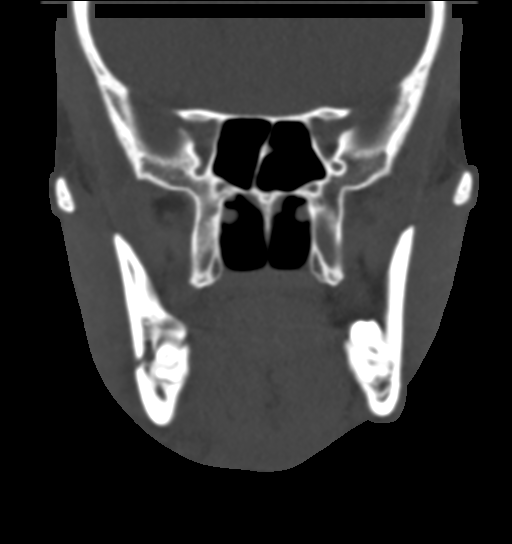

[Series 7: sagittal soft · sagittal · 0.33mm/px · 3 of 85 slices shown]
[im 29/85  bone]
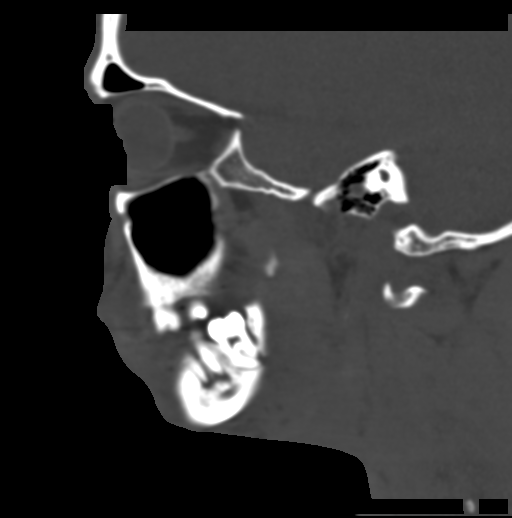
[im 43/85  bone]
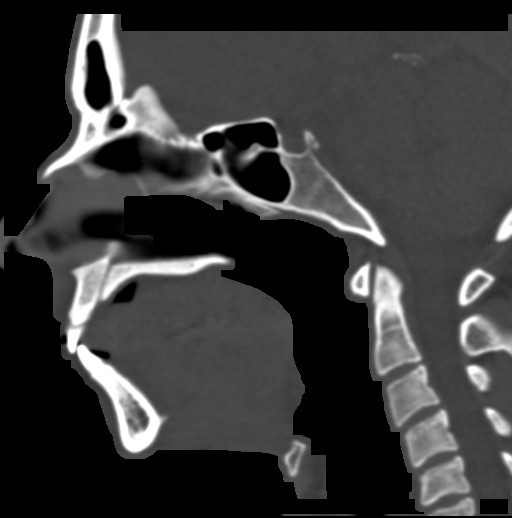
[im 57/85  bone]
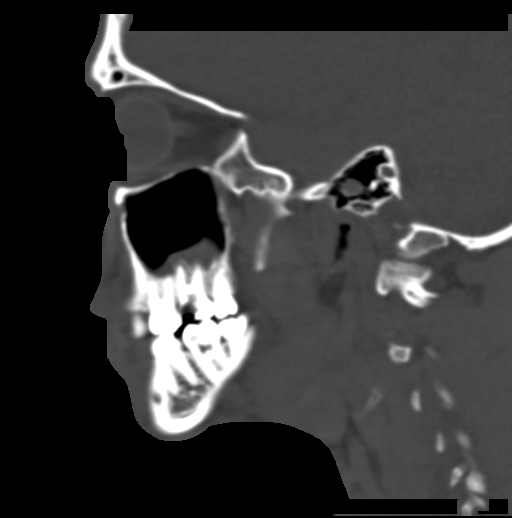

[16 of 47 positions shown; findings below may reference images not displayed]

FINDINGS: Osseous: Zygomatic arches intact. No acute maxillary fracture.
Pterygoid plates intact. Nasal bones intact. Nasal septum bowed to
the right but intact. There is an acute nondisplaced fracture
extending through the right mandibular body near the angle of the
mandible. Extension into the root of the right third mandibular
molar. Mandibular condyles remain normally situated. Few scattered
prominent dental caries noted, most notable about the second
mandibular molars bilaterally.

Orbits: Globes and orbital soft tissues demonstrate no acute
abnormality. Bony orbits intact.

Sinuses: Mild scattered mucosal thickening within the maxillary
sinuses bilaterally, left greater than right. No hemosinus. Mastoid
air cells and middle ear cavities are clear.

Soft tissues: Mild soft tissue swelling overlies the right
mandibular fracture.

Limited intracranial: Unremarkable.
IMPRESSION: 1. Acute nondisplaced fracture extending through the right
mandibular body near the angle of the right mandible.
2. No other acute maxillofacial injury identified.
3. Prominent dental caries, most notable at the second mandibular
molars bilaterally.
